# Patient Record
Sex: Female | Born: 1945 | Race: White | Hispanic: No | Marital: Married | Smoking: Former smoker
Health system: Southern US, Community
[De-identification: ages and names within clinical notes are randomized; demographics above are authoritative.]

## PROBLEM LIST (undated history)

## (undated) DIAGNOSIS — E039 Hypothyroidism, unspecified: Secondary | ICD-10-CM

## (undated) DIAGNOSIS — M810 Age-related osteoporosis without current pathological fracture: Secondary | ICD-10-CM

## (undated) DIAGNOSIS — E079 Disorder of thyroid, unspecified: Secondary | ICD-10-CM

## (undated) DIAGNOSIS — F32A Depression, unspecified: Secondary | ICD-10-CM

## (undated) DIAGNOSIS — F419 Anxiety disorder, unspecified: Secondary | ICD-10-CM

## (undated) DIAGNOSIS — L309 Dermatitis, unspecified: Secondary | ICD-10-CM

## (undated) DIAGNOSIS — F329 Major depressive disorder, single episode, unspecified: Secondary | ICD-10-CM

## (undated) DIAGNOSIS — B009 Herpesviral infection, unspecified: Secondary | ICD-10-CM

## (undated) HISTORY — PX: ABDOMINAL HYSTERECTOMY: SHX81

## (undated) HISTORY — PX: CARPAL TUNNEL RELEASE: SHX101

## (undated) HISTORY — DX: Dermatitis, unspecified: L30.9

## (undated) HISTORY — PX: KNEE ARTHROSCOPY: SHX127

## (undated) HISTORY — DX: Disorder of thyroid, unspecified: E07.9

## (undated) HISTORY — DX: Depression, unspecified: F32.A

## (undated) HISTORY — DX: Anxiety disorder, unspecified: F41.9

## (undated) HISTORY — DX: Major depressive disorder, single episode, unspecified: F32.9

## (undated) HISTORY — PX: BUNIONECTOMY: SHX129

## (undated) HISTORY — DX: Age-related osteoporosis without current pathological fracture: M81.0

## (undated) HISTORY — DX: Herpesviral infection, unspecified: B00.9

---

## 1997-11-17 ENCOUNTER — Encounter: Payer: Self-pay | Admitting: Family Medicine

## 1998-11-23 ENCOUNTER — Other Ambulatory Visit: Admission: RE | Admit: 1998-11-23 | Discharge: 1998-11-23 | Payer: Self-pay | Admitting: Obstetrics and Gynecology

## 1999-12-06 ENCOUNTER — Other Ambulatory Visit: Admission: RE | Admit: 1999-12-06 | Discharge: 1999-12-06 | Payer: Self-pay | Admitting: Obstetrics and Gynecology

## 2000-12-25 ENCOUNTER — Other Ambulatory Visit: Admission: RE | Admit: 2000-12-25 | Discharge: 2000-12-25 | Payer: Self-pay | Admitting: Obstetrics and Gynecology

## 2001-12-31 ENCOUNTER — Other Ambulatory Visit: Admission: RE | Admit: 2001-12-31 | Discharge: 2001-12-31 | Payer: Self-pay | Admitting: Obstetrics and Gynecology

## 2003-02-24 ENCOUNTER — Other Ambulatory Visit: Admission: RE | Admit: 2003-02-24 | Discharge: 2003-02-24 | Payer: Self-pay | Admitting: Obstetrics and Gynecology

## 2003-04-10 ENCOUNTER — Ambulatory Visit (HOSPITAL_COMMUNITY): Admission: RE | Admit: 2003-04-10 | Discharge: 2003-04-10 | Payer: Self-pay | Admitting: Gastroenterology

## 2003-04-10 ENCOUNTER — Encounter: Payer: Self-pay | Admitting: Family Medicine

## 2005-10-24 ENCOUNTER — Ambulatory Visit: Payer: Self-pay

## 2005-10-24 ENCOUNTER — Ambulatory Visit: Payer: Self-pay | Admitting: Internal Medicine

## 2006-09-19 ENCOUNTER — Encounter: Payer: Self-pay | Admitting: Family Medicine

## 2007-08-21 ENCOUNTER — Ambulatory Visit: Payer: Self-pay | Admitting: Family Medicine

## 2007-08-21 DIAGNOSIS — J309 Allergic rhinitis, unspecified: Secondary | ICD-10-CM | POA: Insufficient documentation

## 2007-08-21 DIAGNOSIS — N951 Menopausal and female climacteric states: Secondary | ICD-10-CM

## 2007-08-21 DIAGNOSIS — M8588 Other specified disorders of bone density and structure, other site: Secondary | ICD-10-CM | POA: Insufficient documentation

## 2007-08-21 DIAGNOSIS — E785 Hyperlipidemia, unspecified: Secondary | ICD-10-CM

## 2007-08-21 DIAGNOSIS — L259 Unspecified contact dermatitis, unspecified cause: Secondary | ICD-10-CM

## 2007-10-29 ENCOUNTER — Ambulatory Visit: Payer: Self-pay | Admitting: Family Medicine

## 2007-10-29 DIAGNOSIS — L293 Anogenital pruritus, unspecified: Secondary | ICD-10-CM | POA: Insufficient documentation

## 2007-11-26 ENCOUNTER — Ambulatory Visit: Payer: Self-pay | Admitting: Family Medicine

## 2007-11-27 LAB — CONVERTED CEMR LAB
ALT: 26 units/L (ref 0–35)
AST: 38 units/L — ABNORMAL HIGH (ref 0–37)
Albumin: 4.1 g/dL (ref 3.5–5.2)
Alkaline Phosphatase: 43 units/L (ref 39–117)
BUN: 15 mg/dL (ref 6–23)
Basophils Absolute: 0 10*3/uL (ref 0.0–0.1)
Basophils Relative: 0.1 % (ref 0.0–3.0)
Bilirubin, Direct: 0.2 mg/dL (ref 0.0–0.3)
CO2: 31 meq/L (ref 19–32)
Calcium: 9.2 mg/dL (ref 8.4–10.5)
Direct LDL: 102.6 mg/dL
GFR calc non Af Amer: 60 mL/min
Glucose, Bld: 80 mg/dL (ref 70–99)
HCT: 39.8 % (ref 36.0–46.0)
HDL: 78.4 mg/dL (ref >39.0)
Hemoglobin: 13.7 g/dL (ref 12.0–15.0)
MCV: 101 fL — ABNORMAL HIGH (ref 78.0–100.0)
Monocytes Absolute: 0.4 10*3/uL (ref 0.1–1.0)
Neutro Abs: 1.9 10*3/uL (ref 1.4–7.7)
Potassium: 4.3 meq/L (ref 3.5–5.1)
RBC: 3.94 M/uL (ref 3.87–5.11)
RDW: 11.8 % (ref 11.5–14.6)
Sodium: 141 meq/L (ref 135–145)
TSH: 33.55 microintl units/mL — ABNORMAL HIGH (ref 0.35–5.50)

## 2007-12-04 ENCOUNTER — Ambulatory Visit: Payer: Self-pay | Admitting: Family Medicine

## 2007-12-04 ENCOUNTER — Other Ambulatory Visit: Admission: RE | Admit: 2007-12-04 | Discharge: 2007-12-04 | Payer: Self-pay | Admitting: Family Medicine

## 2007-12-04 ENCOUNTER — Encounter: Payer: Self-pay | Admitting: Family Medicine

## 2007-12-04 DIAGNOSIS — E039 Hypothyroidism, unspecified: Secondary | ICD-10-CM | POA: Insufficient documentation

## 2007-12-09 LAB — CONVERTED CEMR LAB: T3 Uptake Ratio: 38 % — ABNORMAL HIGH (ref 22.5–37.0)

## 2007-12-10 ENCOUNTER — Encounter: Admission: RE | Admit: 2007-12-10 | Discharge: 2007-12-10 | Payer: Self-pay | Admitting: Family Medicine

## 2007-12-10 ENCOUNTER — Encounter: Payer: Self-pay | Admitting: Family Medicine

## 2007-12-20 ENCOUNTER — Telehealth (INDEPENDENT_AMBULATORY_CARE_PROVIDER_SITE_OTHER): Payer: Self-pay | Admitting: *Deleted

## 2008-01-07 ENCOUNTER — Encounter: Admission: RE | Admit: 2008-01-07 | Discharge: 2008-01-07 | Payer: Self-pay | Admitting: Family Medicine

## 2008-01-16 ENCOUNTER — Encounter: Admission: RE | Admit: 2008-01-16 | Discharge: 2008-01-16 | Payer: Self-pay | Admitting: Family Medicine

## 2008-01-23 DIAGNOSIS — R928 Other abnormal and inconclusive findings on diagnostic imaging of breast: Secondary | ICD-10-CM | POA: Insufficient documentation

## 2008-01-31 ENCOUNTER — Ambulatory Visit: Payer: Self-pay | Admitting: Family Medicine

## 2008-02-05 LAB — CONVERTED CEMR LAB: TSH: 12.91 microintl units/mL — ABNORMAL HIGH (ref 0.35–5.50)

## 2008-02-08 ENCOUNTER — Ambulatory Visit: Payer: Self-pay | Admitting: Family Medicine

## 2008-02-14 ENCOUNTER — Encounter: Payer: Self-pay | Admitting: Family Medicine

## 2008-02-15 ENCOUNTER — Encounter: Payer: Self-pay | Admitting: Family Medicine

## 2008-02-15 ENCOUNTER — Telehealth: Payer: Self-pay | Admitting: Family Medicine

## 2008-03-24 ENCOUNTER — Ambulatory Visit: Payer: Self-pay | Admitting: Family Medicine

## 2008-03-25 ENCOUNTER — Ambulatory Visit: Payer: Self-pay | Admitting: Family Medicine

## 2008-03-25 ENCOUNTER — Encounter (INDEPENDENT_AMBULATORY_CARE_PROVIDER_SITE_OTHER): Payer: Self-pay | Admitting: Internal Medicine

## 2008-03-25 DIAGNOSIS — M25559 Pain in unspecified hip: Secondary | ICD-10-CM

## 2008-03-25 LAB — CONVERTED CEMR LAB
Blood in Urine, dipstick: NEGATIVE
Nitrite: NEGATIVE
pH: 6

## 2008-03-26 ENCOUNTER — Telehealth (INDEPENDENT_AMBULATORY_CARE_PROVIDER_SITE_OTHER): Payer: Self-pay | Admitting: Internal Medicine

## 2008-04-14 ENCOUNTER — Encounter: Payer: Self-pay | Admitting: Family Medicine

## 2008-12-17 ENCOUNTER — Ambulatory Visit: Payer: Self-pay | Admitting: Family Medicine

## 2008-12-17 ENCOUNTER — Encounter (INDEPENDENT_AMBULATORY_CARE_PROVIDER_SITE_OTHER): Payer: Self-pay | Admitting: Internal Medicine

## 2008-12-23 ENCOUNTER — Ambulatory Visit: Payer: Self-pay | Admitting: Family Medicine

## 2008-12-23 DIAGNOSIS — J020 Streptococcal pharyngitis: Secondary | ICD-10-CM

## 2008-12-23 DIAGNOSIS — L2089 Other atopic dermatitis: Secondary | ICD-10-CM

## 2009-05-26 ENCOUNTER — Telehealth: Payer: Self-pay | Admitting: Family Medicine

## 2010-02-21 ENCOUNTER — Encounter: Payer: Self-pay | Admitting: Family Medicine

## 2010-03-04 NOTE — Progress Notes (Signed)
Summary: refill request for valtrex  Phone Note Refill Request Message from:  Fax from Pharmacy  Refills Requested: Medication #1:  VALTREX 1 GM  TABS 1/2  to 1 by mouth once daily as directed Faxed request from [facility].  Request is for 500mg 's, take one to two daily.  Please advise.  Initial call taken by: Lowella Petties CMA,  May 26, 2009 3:03 PM  Follow-up for Phone Call        px written on EMR for call in   Follow-up by: Judith Part MD,  May 26, 2009 4:58 PM  Additional Follow-up for Phone Call Additional follow up Details #1::        Medication phoned to [facility] pharmacy as instructed. Lewanda Rife LPN  May 26, 2009 5:02 PM     New/Updated Medications: VALTREX 500 MG TABS (VALACYCLOVIR HCL) 1-2 by mouth once daily as directed Prescriptions: VALTREX 500 MG TABS (VALACYCLOVIR HCL) 1-2 by mouth once daily as directed  #60 x 11   Entered and Authorized by:   Judith Part MD   Signed by:   Lewanda Rife LPN on 91/47/8295   Method used:   Telephoned to ...       [facility]* (retail)       [address]       [place], [place]  [address]       Ph: [phone]       Fax: [phone]   RxID:   [id number]

## 2010-05-27 ENCOUNTER — Other Ambulatory Visit: Payer: Self-pay | Admitting: *Deleted

## 2010-05-27 DIAGNOSIS — B009 Herpesviral infection, unspecified: Secondary | ICD-10-CM | POA: Insufficient documentation

## 2010-05-27 MED ORDER — VALACYCLOVIR HCL 500 MG PO TABS: ORAL_TABLET | ORAL | Status: DC

## 2010-05-27 NOTE — Telephone Encounter (Signed)
Medication phoned to [facility] as instructed.  

## 2010-05-27 NOTE — Telephone Encounter (Signed)
Px written for call in   

## 2010-06-18 NOTE — Op Note (Signed)
Courtney Jensen, Courtney Jensen                         ACCOUNT NO.:  [id number]   MEDICAL RECORD NO.:  [id number]                   PATIENT TYPE:  AMB   LOCATION:  ENDO                                 FACILITY:  [facility]   PHYSICIAN:  Bernette Redbird, M.D.                DATE OF BIRTH:  05-Oct-1945   DATE OF PROCEDURE:  04/10/2003  DATE OF DISCHARGE:                                 OPERATIVE REPORT   PROCEDURE:  Colonoscopy.   INDICATIONS:  Screening for colon cancer in an asymptomatic 65 year old  female.   FINDINGS:  Normal exam to the cecum.   DESCRIPTION OF PROCEDURE:  The nature, purpose and risks of the procedure  have been discussed with the patient who provided written consent.  Sedation  was fentanyl 75 mcg and Versed 6 mg IV without arrhythmias or desaturation.  The Olympus adjustable tension pediatric video coloscope was advanced around  the colon to the base of the cecum as identified by clear visualization of  the appendiceal orifice, turning the patient into the supine position to  facilitate the tip of the scope dropping into the base of the cecum.  Pull-  back was then performed.  The quality of prep was excellent and it was felt  that all areas were well seen.   This was a normal examination.  No polyps, cancer, colitis, vascular  malformations, or diverticulosis were noted and retroflexion of the rectum  was normal.  No biopsies were obtained. The patient tolerated the procedure  well and there were no apparent complications.   IMPRESSION:  Normal colonoscopy to the cecum.   PLAN:  Flexible sigmoidoscopy in five years.                                               Bernette Redbird, M.D.    RB/MEDQ  D:  04/10/2003  T:  04/10/2003  Job:  [id number]   cc:   Juluis Mire, M.D.  [address] [place]  [place] [address]  Fax: [phone]

## 2010-06-18 NOTE — Procedures (Signed)
[facility]                                EXERCISE SHADA, NIENABER                      MRN:          [id number]  DATE:10/24/2005                            DOB:          [birth year]    REFERRING PHYSICIAN:  Juluis Mire, M.D.   REFERRING PHYSICIAN:  Juluis Mire, M.D. at [facility] in  [place].   PATIENT IDENTIFICATION:  Courtney Jensen is a very pleasant 65 year old woman  with a strong family history of coronary artery disease as well as  hyperlipidemia.  She is referred for a routine screening treadmill test.   BASELINE DATA:  Resting blood pressure was 105/72 with a heart rate of 64.  EKG showed normal sinus rhythm at a rate of 64 and a slight rightward axis.  No significant ST or T-wave changes.   EXERCISE DATA:  The patient walked for 12 minutes on a standard Bruce  protocol.  She stopped due to fatigue and feeling hot.  There is no chest  pain or shortness of breath.  Peak heart rate was 142 which is 88% of age  predicted maximum.  Peak blood pressure was 158/67.   Exercise EKG showed mild upsloping ST depression which was nondiagnostic.  In early recovery, there were frequent PVCs and a very short run of  supraventricular tachycardia which was asymptomatic.   Post exercise, there was good heart rate recovery with a drop of  approximately 45 beats in the first minute.   CONCLUSION:  1. Negative exercise treadmill with no evidence of exercise-induced      ischemia.  2. Excellent exercise tolerance.  3. Good heart rate recovery.  4. Frequent premature ventricular contractures and brief run of      supraventricular tachycardias in recovery.   This was a negative stress test.  Would recommend risk factor management.                                   Bevelyn Buckles. Bensimhon, MD   DRB/MedQ  DD:  10/24/2005  DT:  10/26/2005  Job #:  [id number]   cc:   Juluis Mire, M.D.

## 2011-04-11 DIAGNOSIS — E039 Hypothyroidism, unspecified: Secondary | ICD-10-CM | POA: Diagnosis not present

## 2011-07-11 ENCOUNTER — Other Ambulatory Visit: Payer: Self-pay | Admitting: *Deleted

## 2011-07-11 NOTE — Telephone Encounter (Signed)
Please find out if she is still a pt here - if so refil for 1 mo and make f/u appt-thanks

## 2011-07-11 NOTE — Telephone Encounter (Signed)
Spoke with patient and she said she has typically been getting her refills from her GYN. She said the pharmacy must've gotten things confused. Offered to refill and schedule follow up for patient and she refused stating she had appt with GYN next week and would call that office for RF.

## 2011-07-11 NOTE — Telephone Encounter (Signed)
Received faxed refill request from pharmacy. Patient has not been seen in over 2 years. Is it okay to refill medication?

## 2011-07-14 ENCOUNTER — Other Ambulatory Visit: Payer: Self-pay | Admitting: Family Medicine

## 2011-07-14 NOTE — Telephone Encounter (Signed)
Electronic refill request

## 2011-07-15 NOTE — Telephone Encounter (Signed)
Sent!

## 2011-07-18 DIAGNOSIS — Z1231 Encounter for screening mammogram for malignant neoplasm of breast: Secondary | ICD-10-CM | POA: Diagnosis not present

## 2011-07-18 DIAGNOSIS — Z01419 Encounter for gynecological examination (general) (routine) without abnormal findings: Secondary | ICD-10-CM | POA: Diagnosis not present

## 2011-08-01 DIAGNOSIS — H35369 Drusen (degenerative) of macula, unspecified eye: Secondary | ICD-10-CM | POA: Diagnosis not present

## 2011-08-01 DIAGNOSIS — H524 Presbyopia: Secondary | ICD-10-CM | POA: Diagnosis not present

## 2011-10-17 DIAGNOSIS — E039 Hypothyroidism, unspecified: Secondary | ICD-10-CM | POA: Diagnosis not present

## 2011-11-17 ENCOUNTER — Ambulatory Visit (INDEPENDENT_AMBULATORY_CARE_PROVIDER_SITE_OTHER): Payer: Medicare Other | Admitting: Infectious Disease

## 2011-11-17 VITALS — BP 122/81 | HR 65 | Temp 98.0°F | Wt 134.0 lb

## 2011-11-17 DIAGNOSIS — A6000 Herpesviral infection of urogenital system, unspecified: Secondary | ICD-10-CM | POA: Insufficient documentation

## 2011-11-17 DIAGNOSIS — N952 Postmenopausal atrophic vaginitis: Secondary | ICD-10-CM | POA: Diagnosis not present

## 2011-11-17 MED ORDER — VALACYCLOVIR HCL 500 MG PO TABS: 500.0000 mg | ORAL_TABLET | ORAL | Status: DC

## 2011-11-17 NOTE — Progress Notes (Signed)
  Subjective:    Patient ID: Courtney Jensen, female    DOB: 1945-03-02, 66 y.o.   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 Initially lesions would largely occur on buttocks on left side and were far less frequent. Now she is having outbreaks up to every 4-6 weeks DESPITE valtrex 500mg  daily prophylaxis. Outbreaks are now in vaginal area and apparently once intra-rectally. She can have a fair amout of dicomfort with these. She currently has no active lesions. She was referred by her PCP/Ob/GYn for evaluation and workup.  I can see no evidence of obvious immune deficiency, but think we should screen for HIV. I also think the prophylactic dose of valtrex should be increased to see if having more in system round the clock may help. I spent greater than 60 minutes with the patient including greater than 50% of time in face to face counsel of the patient and in coordination of their care.    Review of Systems  Constitutional: Negative for fever, chills, diaphoresis, activity change, appetite change, fatigue and unexpected weight change.  HENT: Negative for congestion, sore throat, rhinorrhea, sneezing, trouble swallowing and sinus pressure.   Eyes: Negative for photophobia and visual disturbance.  Respiratory: Negative for cough, chest tightness, shortness of breath, wheezing and stridor.   Cardiovascular: Negative for chest pain, palpitations and leg swelling.  Gastrointestinal: Negative for nausea, vomiting, abdominal pain, diarrhea, constipation, blood in stool, abdominal distention and anal bleeding.  Genitourinary: Negative for dysuria, hematuria, flank pain and difficulty urinating.  Musculoskeletal: Negative for myalgias, back pain, joint swelling, arthralgias and gait problem.  Skin: Negative for color change, pallor, rash and wound.  Neurological: Negative for dizziness, tremors,  weakness and light-headedness.  Hematological: Negative for adenopathy. Does not bruise/bleed easily.  Psychiatric/Behavioral: Negative for behavioral problems, confusion, disturbed wake/sleep cycle, dysphoric mood, decreased concentration and agitation.       Objective:   Physical Exam  Constitutional: She is oriented to person, place, and time. She appears well-developed and well-nourished. No distress.  HENT:  Head: Normocephalic and atraumatic.  Mouth/Throat: Oropharynx is clear and moist. No oropharyngeal exudate.  Eyes: Conjunctivae normal and EOM are normal. Pupils are equal, round, and reactive to light. No scleral icterus.  Neck: Normal range of motion. Neck supple. No JVD present.  Cardiovascular: Normal rate, regular rhythm and normal heart sounds.  Exam reveals no gallop and no friction rub.   No murmur heard. Pulmonary/Chest: Effort normal and breath sounds normal. No respiratory distress. She has no wheezes. She has no rales. She exhibits no tenderness.  Abdominal: She exhibits no distension and no mass. There is no tenderness. There is no rebound and no guarding.  Musculoskeletal: She exhibits no edema and no tenderness.  Lymphadenopathy:    She has no cervical adenopathy.  Neurological: She is alert and oriented to person, place, and time. She has normal reflexes. She exhibits normal muscle tone. Coordination normal.  Skin: Skin is warm and dry. She is not diaphoretic. No erythema. No pallor.  Psychiatric: She has a normal mood and affect. Her behavior is normal. Judgment and thought content normal.          Assessment & Plan:     Recurrent Genital Herpes: --screen for HIV with ab test --increase valtrex to 500mg  twice daily and then have her use 2 tabs bid for up to 10 days for outbreaks

## 2011-11-18 LAB — HIV ANTIBODY (ROUTINE TESTING W REFLEX): HIV: NONREACTIVE

## 2011-11-22 DIAGNOSIS — Z23 Encounter for immunization: Secondary | ICD-10-CM | POA: Diagnosis not present

## 2012-01-10 DIAGNOSIS — A6004 Herpesviral vulvovaginitis: Secondary | ICD-10-CM | POA: Diagnosis not present

## 2012-02-20 ENCOUNTER — Ambulatory Visit: Payer: Medicare Other | Admitting: Infectious Disease

## 2012-02-22 ENCOUNTER — Ambulatory Visit: Payer: Medicare Other | Admitting: Infectious Disease

## 2012-02-27 ENCOUNTER — Encounter: Payer: Self-pay | Admitting: Infectious Disease

## 2012-02-27 ENCOUNTER — Ambulatory Visit (INDEPENDENT_AMBULATORY_CARE_PROVIDER_SITE_OTHER): Payer: Medicare Other | Admitting: Infectious Disease

## 2012-02-27 VITALS — BP 133/81 | HR 60 | Temp 98.2°F | Wt 138.0 lb

## 2012-02-27 DIAGNOSIS — A6000 Herpesviral infection of urogenital system, unspecified: Secondary | ICD-10-CM

## 2012-02-27 DIAGNOSIS — F411 Generalized anxiety disorder: Secondary | ICD-10-CM | POA: Diagnosis not present

## 2012-02-27 DIAGNOSIS — F419 Anxiety disorder, unspecified: Secondary | ICD-10-CM

## 2012-02-27 MED ORDER — VALACYCLOVIR HCL 500 MG PO TABS: 500.0000 mg | ORAL_TABLET | ORAL | Status: DC

## 2012-02-27 NOTE — Progress Notes (Signed)
Subjective:    Patient ID: Courtney Jensen, female    DOB: 21-Mar-1945, 67 y.o.   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 Initially lesions would largely occur on buttocks on left side and were far less frequent. Now she is having outbreaks up to every 4-6 weeks DESPITE valtrex 500mg  daily prophylaxis. He increased her suppressive Valtrex dose to 500 mg twice daily but despite this she had a very painful outbreak in December. She's not had an outbreak since then but is on him one month's time since. She's been started on a selective serotonin reuptake inhibitor in hopes by primary care physician that this might reduce her stress and potentially reduce episodes of reactivation of her herpes simplex.  I've counseled her that is possible that her herpes might possibly be resistant to acyclovir. We could prove or disprove this if we had active lesions to send for amplification for herpes simplex PCR and for testing for resistance genes. Unfortunately this is the case then further therapy with valacyclovir famciclovir or acyclovir will not be of use. If this is in fact the case there is not really another viable suppressive regimen for her as other drugs that would be acting to be foscarnet and cidofovir both of which have toxicities it would not be worth the risk in this patient.  Review of Systems  Constitutional: Negative for fever, chills, diaphoresis, activity change, appetite change, fatigue and unexpected weight change.  HENT: Negative for congestion, sore throat, rhinorrhea, sneezing, trouble swallowing and sinus pressure.   Eyes: Negative for photophobia and visual disturbance.  Respiratory: Negative for cough, chest tightness, shortness of breath, wheezing and stridor.   Cardiovascular: Negative for chest pain, palpitations and leg swelling.  Gastrointestinal: Negative for nausea,  vomiting, abdominal pain, diarrhea, constipation, blood in stool, abdominal distention and anal bleeding.  Genitourinary: Negative for dysuria, hematuria, flank pain and difficulty urinating.  Musculoskeletal: Negative for myalgias, back pain, joint swelling, arthralgias and gait problem.  Skin: Negative for color change, pallor, rash and wound.  Neurological: Negative for dizziness, tremors, weakness and light-headedness.  Hematological: Negative for adenopathy. Does not bruise/bleed easily.  Psychiatric/Behavioral: Positive for dysphoric mood. Negative for behavioral problems, confusion, sleep disturbance, decreased concentration and agitation.       Objective:   Physical Exam  Constitutional: She is oriented to person, place, and time. She appears well-developed and well-nourished. No distress.  HENT:  Head: Normocephalic and atraumatic.  Mouth/Throat: Oropharynx is clear and moist. No oropharyngeal exudate.  Eyes: Conjunctivae normal and EOM are normal. Pupils are equal, round, and reactive to light. No scleral icterus.  Neck: Normal range of motion. Neck supple. No JVD present.  Cardiovascular: Normal rate, regular rhythm and normal heart sounds.  Exam reveals no gallop and no friction rub.   No murmur heard. Pulmonary/Chest: Effort normal and breath sounds normal. No respiratory distress. She has no wheezes. She has no rales. She exhibits no tenderness.  Abdominal: She exhibits no distension and no mass. There is no tenderness. There is no rebound and no guarding.  Musculoskeletal: She exhibits no edema and no tenderness.  Lymphadenopathy:    She has no cervical adenopathy.  Neurological: She is alert and oriented to person, place, and time. She exhibits normal muscle tone. Coordination normal.  Skin: Skin is warm and dry. She is not diaphoretic. No erythema. No pallor.  Psychiatric: She has a normal mood and  affect. Her behavior is normal. Judgment and thought content normal.           Assessment & Plan:  HSV recurrent: increase dose of valtrex to 1g in am and 500mg  in the pm. If she has another outbreak I would like her to come to clinic so that we can unroof a lesion send it for PCR amplification and Resistance testing  Anxiety: her SSRI should be fine to take with this and fu with her PCP

## 2012-03-17 ENCOUNTER — Other Ambulatory Visit: Payer: Self-pay

## 2012-04-23 ENCOUNTER — Ambulatory Visit (INDEPENDENT_AMBULATORY_CARE_PROVIDER_SITE_OTHER): Payer: Medicare Other | Admitting: Infectious Disease

## 2012-04-23 ENCOUNTER — Encounter: Payer: Self-pay | Admitting: Infectious Disease

## 2012-04-23 VITALS — BP 121/71 | HR 57 | Temp 97.8°F | Wt 138.0 lb

## 2012-04-23 DIAGNOSIS — A6000 Herpesviral infection of urogenital system, unspecified: Secondary | ICD-10-CM

## 2012-04-23 DIAGNOSIS — F329 Major depressive disorder, single episode, unspecified: Secondary | ICD-10-CM

## 2012-04-23 NOTE — Progress Notes (Signed)
Subjective:    Patient ID: Courtney Jensen, female    DOB: [birth year], 67 y.o.   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 Initially lesions would largely occur on buttocks on left side and were far less frequent.  She then started having outbreaks up to every 4-6 weeks DESPITE valtrex 500mg  daily prophylaxis. We increased her suppressive Valtrex dose to 500 mg twice daily but despite this she had a very painful outbreak in December.   We  counseled her that is possible that her herpes might possibly be resistant to acyclovir. We could prove or disprove this if we had active lesions to send for amplification for herpes simplex PCR and for testing for resistance genes.   In the interim we went up to 1 g in am and 500mg  in pm and she had ONE episode in January (was out of town but could not come in for testing of virus) Since then NO outbreaks  Review of Systems  Constitutional: Negative for fever, chills, diaphoresis, activity change, appetite change, fatigue and unexpected weight change.  HENT: Negative for congestion, sore throat, rhinorrhea, sneezing, trouble swallowing and sinus pressure.   Eyes: Negative for photophobia and visual disturbance.  Respiratory: Negative for cough, chest tightness, shortness of breath, wheezing and stridor.   Cardiovascular: Negative for chest pain, palpitations and leg swelling.  Gastrointestinal: Negative for nausea, vomiting, abdominal pain, diarrhea, constipation, blood in stool, abdominal distention and anal bleeding.  Genitourinary: Negative for dysuria, hematuria, flank pain and difficulty urinating.  Musculoskeletal: Negative for myalgias, back pain, joint swelling, arthralgias and gait problem.  Skin: Negative for color change, pallor, rash and wound.  Neurological: Negative for dizziness, tremors, weakness and light-headedness.   Hematological: Negative for adenopathy. Does not bruise/bleed easily.  Psychiatric/Behavioral: Positive for dysphoric mood. Negative for behavioral problems, confusion, sleep disturbance, decreased concentration and agitation.       Objective:   Physical Exam  Constitutional: She is oriented to person, place, and time. She appears well-developed and well-nourished. No distress.  HENT:  Head: Normocephalic and atraumatic.  Mouth/Throat: Oropharynx is clear and moist. No oropharyngeal exudate.  Eyes: Conjunctivae and EOM are normal. Pupils are equal, round, and reactive to light. No scleral icterus.  Neck: Normal range of motion. Neck supple. No JVD present.  Cardiovascular: Normal rate, regular rhythm and normal heart sounds.  Exam reveals no gallop and no friction rub.   No murmur heard. Pulmonary/Chest: Effort normal and breath sounds normal. No respiratory distress. She has no wheezes. She has no rales. She exhibits no tenderness.  Abdominal: She exhibits no distension and no mass. There is no tenderness. There is no rebound and no guarding.  Musculoskeletal: She exhibits no edema and no tenderness.  Lymphadenopathy:    She has no cervical adenopathy.  Neurological: She is alert and oriented to person, place, and time. She exhibits normal muscle tone. Coordination normal.  Skin: Skin is warm and dry. She is not diaphoretic. No erythema. No pallor.  Psychiatric: She has a normal mood and affect. Her behavior is normal. Judgment and thought content normal.          Assessment & Plan:  HSV recurrent: continue increased dose of valtrex to 1g in am and 500mg  in the pm. If she has another outbreak I would like her to come to clinic so that we can unroof a lesion send it for PCR amplification  and Resistance testing  Anxiety:  On SSRI s

## 2012-05-09 ENCOUNTER — Telehealth: Payer: Self-pay | Admitting: *Deleted

## 2012-05-09 NOTE — Telephone Encounter (Signed)
Received refill request from [facility] for valacyclovir.  Dose increased at last OV.  Message left requesting pt to call [facility] to let MD know whether the increased dose is suppressing the herpes lesions prior to refilling medication.

## 2012-05-10 ENCOUNTER — Other Ambulatory Visit: Payer: Self-pay | Admitting: *Deleted

## 2012-05-10 DIAGNOSIS — B029 Zoster without complications: Secondary | ICD-10-CM

## 2012-05-10 MED ORDER — VALACYCLOVIR HCL 500 MG PO TABS: 500.0000 mg | ORAL_TABLET | ORAL | Status: DC

## 2012-08-15 ENCOUNTER — Telehealth: Payer: Self-pay | Admitting: *Deleted

## 2012-08-15 DIAGNOSIS — B029 Zoster without complications: Secondary | ICD-10-CM

## 2012-08-15 MED ORDER — VALACYCLOVIR HCL 500 MG PO TABS: 500.0000 mg | ORAL_TABLET | ORAL | Status: DC

## 2012-08-15 NOTE — Telephone Encounter (Signed)
Fine to refill 

## 2012-09-05 ENCOUNTER — Other Ambulatory Visit: Payer: Self-pay

## 2012-10-08 DIAGNOSIS — E039 Hypothyroidism, unspecified: Secondary | ICD-10-CM | POA: Diagnosis not present

## 2012-10-08 DIAGNOSIS — Z79899 Other long term (current) drug therapy: Secondary | ICD-10-CM | POA: Diagnosis not present

## 2012-10-08 DIAGNOSIS — Z131 Encounter for screening for diabetes mellitus: Secondary | ICD-10-CM | POA: Diagnosis not present

## 2012-10-08 DIAGNOSIS — Z1231 Encounter for screening mammogram for malignant neoplasm of breast: Secondary | ICD-10-CM | POA: Diagnosis not present

## 2012-10-08 DIAGNOSIS — Z13820 Encounter for screening for osteoporosis: Secondary | ICD-10-CM | POA: Diagnosis not present

## 2012-10-08 DIAGNOSIS — E7800 Pure hypercholesterolemia, unspecified: Secondary | ICD-10-CM | POA: Diagnosis not present

## 2012-10-08 DIAGNOSIS — M899 Disorder of bone, unspecified: Secondary | ICD-10-CM | POA: Diagnosis not present

## 2012-10-15 DIAGNOSIS — E039 Hypothyroidism, unspecified: Secondary | ICD-10-CM | POA: Diagnosis not present

## 2012-10-15 DIAGNOSIS — Z124 Encounter for screening for malignant neoplasm of cervix: Secondary | ICD-10-CM | POA: Diagnosis not present

## 2012-10-15 DIAGNOSIS — N952 Postmenopausal atrophic vaginitis: Secondary | ICD-10-CM | POA: Diagnosis not present

## 2012-10-15 DIAGNOSIS — A6000 Herpesviral infection of urogenital system, unspecified: Secondary | ICD-10-CM | POA: Diagnosis not present

## 2012-10-29 ENCOUNTER — Ambulatory Visit (INDEPENDENT_AMBULATORY_CARE_PROVIDER_SITE_OTHER): Payer: Medicare Other | Admitting: Infectious Disease

## 2012-10-29 ENCOUNTER — Encounter: Payer: Self-pay | Admitting: Infectious Disease

## 2012-10-29 VITALS — BP 125/75 | HR 51 | Temp 98.5°F | Wt 147.0 lb

## 2012-10-29 DIAGNOSIS — F341 Dysthymic disorder: Secondary | ICD-10-CM

## 2012-10-29 DIAGNOSIS — F329 Major depressive disorder, single episode, unspecified: Secondary | ICD-10-CM

## 2012-10-29 DIAGNOSIS — B009 Herpesviral infection, unspecified: Secondary | ICD-10-CM | POA: Diagnosis not present

## 2012-10-29 DIAGNOSIS — Z23 Encounter for immunization: Secondary | ICD-10-CM

## 2012-10-29 DIAGNOSIS — L309 Dermatitis, unspecified: Secondary | ICD-10-CM | POA: Insufficient documentation

## 2012-10-29 NOTE — Progress Notes (Signed)
  Subjective:    Patient ID: Courtney Jensen, female    DOB: 24-Jan-1946, 67 y.o.   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 Initially lesions would largely occur on buttocks on left side and were far less frequent.  She then started having outbreaks up to every 4-6 weeks DESPITE valtrex 500mg  daily prophylaxis. We increased her suppressive Valtrex dose to 500 mg twice daily but despite this she had a very painful outbreak in December.   We then went up to 1 g in am and 500mg  in pm and she had she has had very infrequent outbreaks since then. She correlates outbreaks with missing the full dose cyst of her Valtrex. She is also been started on Zoloft which he thinks is helping.  Review of Systems  Constitutional: Negative for fever, chills, diaphoresis, activity change, appetite change, fatigue and unexpected weight change.  HENT: Negative for congestion, sore throat, rhinorrhea, sneezing, trouble swallowing and sinus pressure.   Eyes: Negative for photophobia and visual disturbance.  Respiratory: Negative for cough, chest tightness, shortness of breath, wheezing and stridor.   Cardiovascular: Negative for chest pain, palpitations and leg swelling.  Gastrointestinal: Negative for nausea, vomiting, abdominal pain, diarrhea, constipation, blood in stool, abdominal distention and anal bleeding.  Genitourinary: Negative for dysuria, hematuria, flank pain and difficulty urinating.  Musculoskeletal: Negative for myalgias, back pain, joint swelling, arthralgias and gait problem.  Skin: Negative for color change, pallor, rash and wound.  Neurological: Negative for dizziness, tremors, weakness and light-headedness.  Hematological: Negative for adenopathy. Does not bruise/bleed easily.  Psychiatric/Behavioral: Positive for dysphoric mood. Negative for behavioral problems, confusion, sleep  disturbance, decreased concentration and agitation.       Objective:   Physical Exam  Constitutional: She is oriented to person, place, and time. She appears well-developed and well-nourished. No distress.  HENT:  Head: Normocephalic and atraumatic.  Mouth/Throat: Oropharynx is clear and moist. No oropharyngeal exudate.  Eyes: Conjunctivae and EOM are normal. Pupils are equal, round, and reactive to light. No scleral icterus.  Neck: Normal range of motion. Neck supple. No JVD present.  Cardiovascular: Normal rate, regular rhythm and normal heart sounds.  Exam reveals no gallop and no friction rub.   No murmur heard. Pulmonary/Chest: Effort normal and breath sounds normal. No respiratory distress. She has no wheezes. She has no rales. She exhibits no tenderness.  Abdominal: She exhibits no distension and no mass. There is no tenderness. There is no rebound and no guarding.  Musculoskeletal: She exhibits no edema and no tenderness.  Lymphadenopathy:    She has no cervical adenopathy.  Neurological: She is alert and oriented to person, place, and time. She exhibits normal muscle tone. Coordination normal.  Skin: Skin is warm and dry. She is not diaphoretic. No erythema. No pallor.  Psychiatric: She has a normal mood and affect. Her behavior is normal. Judgment and thought content normal.          Assessment & Plan:  HSV recurrent: continue increased dose of valtrex to 1g in am and 500mg  in the pm. If she has another outbreak I would like her to come to clinic so that we can unroof a lesion send it for PCR amplification and Resistance testing  Anxiety:  On SSRI s  HCM: give flu shot

## 2012-11-16 ENCOUNTER — Other Ambulatory Visit: Payer: Self-pay | Admitting: *Deleted

## 2012-11-16 DIAGNOSIS — B029 Zoster without complications: Secondary | ICD-10-CM

## 2012-11-16 MED ORDER — VALACYCLOVIR HCL 500 MG PO TABS: 500.0000 mg | ORAL_TABLET | ORAL | Status: DC

## 2013-02-12 ENCOUNTER — Other Ambulatory Visit: Payer: Self-pay | Admitting: Licensed Clinical Social Worker

## 2013-02-12 DIAGNOSIS — B029 Zoster without complications: Secondary | ICD-10-CM

## 2013-02-12 MED ORDER — VALACYCLOVIR HCL 500 MG PO TABS: 500.0000 mg | ORAL_TABLET | ORAL | Status: DC

## 2013-02-25 DIAGNOSIS — H524 Presbyopia: Secondary | ICD-10-CM | POA: Diagnosis not present

## 2013-02-25 DIAGNOSIS — H2510 Age-related nuclear cataract, unspecified eye: Secondary | ICD-10-CM | POA: Diagnosis not present

## 2013-04-13 LAB — HM MAMMOGRAPHY: HM MAMMO: NORMAL

## 2013-04-13 LAB — HM DEXA SCAN

## 2013-04-16 ENCOUNTER — Emergency Department: Payer: Self-pay | Admitting: Emergency Medicine

## 2013-04-16 DIAGNOSIS — E039 Hypothyroidism, unspecified: Secondary | ICD-10-CM | POA: Diagnosis not present

## 2013-04-16 DIAGNOSIS — S0990XA Unspecified injury of head, initial encounter: Secondary | ICD-10-CM | POA: Diagnosis not present

## 2013-04-16 DIAGNOSIS — S0083XA Contusion of other part of head, initial encounter: Secondary | ICD-10-CM | POA: Diagnosis not present

## 2013-04-16 DIAGNOSIS — Z79899 Other long term (current) drug therapy: Secondary | ICD-10-CM | POA: Diagnosis not present

## 2013-04-16 DIAGNOSIS — S0180XA Unspecified open wound of other part of head, initial encounter: Secondary | ICD-10-CM | POA: Diagnosis not present

## 2013-04-16 DIAGNOSIS — S0003XA Contusion of scalp, initial encounter: Secondary | ICD-10-CM | POA: Diagnosis not present

## 2013-04-16 DIAGNOSIS — R51 Headache: Secondary | ICD-10-CM | POA: Diagnosis not present

## 2013-04-25 ENCOUNTER — Emergency Department: Payer: Self-pay | Admitting: Emergency Medicine

## 2013-05-14 LAB — HM COLONOSCOPY: HM Colonoscopy: NORMAL

## 2013-05-21 ENCOUNTER — Other Ambulatory Visit: Payer: Self-pay | Admitting: Licensed Clinical Social Worker

## 2013-05-21 DIAGNOSIS — B029 Zoster without complications: Secondary | ICD-10-CM

## 2013-05-21 MED ORDER — VALACYCLOVIR HCL 500 MG PO TABS: 500.0000 mg | ORAL_TABLET | ORAL | Status: DC

## 2013-08-23 ENCOUNTER — Other Ambulatory Visit: Payer: Self-pay | Admitting: Licensed Clinical Social Worker

## 2013-08-23 DIAGNOSIS — B029 Zoster without complications: Secondary | ICD-10-CM

## 2013-08-23 MED ORDER — VALACYCLOVIR HCL 500 MG PO TABS: 500.0000 mg | ORAL_TABLET | ORAL | Status: DC

## 2013-09-17 DIAGNOSIS — K5730 Diverticulosis of large intestine without perforation or abscess without bleeding: Secondary | ICD-10-CM | POA: Diagnosis not present

## 2013-09-17 DIAGNOSIS — Z1211 Encounter for screening for malignant neoplasm of colon: Secondary | ICD-10-CM | POA: Diagnosis not present

## 2013-09-19 ENCOUNTER — Other Ambulatory Visit: Payer: Self-pay | Admitting: Licensed Clinical Social Worker

## 2013-09-19 DIAGNOSIS — B029 Zoster without complications: Secondary | ICD-10-CM

## 2013-09-19 MED ORDER — VALACYCLOVIR HCL 500 MG PO TABS: 500.0000 mg | ORAL_TABLET | ORAL | Status: DC

## 2013-10-14 DIAGNOSIS — E039 Hypothyroidism, unspecified: Secondary | ICD-10-CM | POA: Diagnosis not present

## 2013-11-01 DIAGNOSIS — Z23 Encounter for immunization: Secondary | ICD-10-CM | POA: Diagnosis not present

## 2013-12-09 DIAGNOSIS — E785 Hyperlipidemia, unspecified: Secondary | ICD-10-CM | POA: Diagnosis not present

## 2013-12-23 DIAGNOSIS — Z1231 Encounter for screening mammogram for malignant neoplasm of breast: Secondary | ICD-10-CM | POA: Diagnosis not present

## 2013-12-23 DIAGNOSIS — Z01419 Encounter for gynecological examination (general) (routine) without abnormal findings: Secondary | ICD-10-CM | POA: Diagnosis not present

## 2014-01-20 ENCOUNTER — Encounter: Payer: Self-pay | Admitting: Infectious Disease

## 2014-01-20 ENCOUNTER — Ambulatory Visit (INDEPENDENT_AMBULATORY_CARE_PROVIDER_SITE_OTHER): Payer: Medicare Other | Admitting: Infectious Disease

## 2014-01-20 VITALS — BP 119/76 | HR 60 | Temp 98.4°F | Wt 144.0 lb

## 2014-01-20 DIAGNOSIS — L218 Other seborrheic dermatitis: Secondary | ICD-10-CM

## 2014-01-20 DIAGNOSIS — L219 Seborrheic dermatitis, unspecified: Secondary | ICD-10-CM

## 2014-01-20 DIAGNOSIS — B009 Herpesviral infection, unspecified: Secondary | ICD-10-CM | POA: Diagnosis not present

## 2014-01-20 DIAGNOSIS — B029 Zoster without complications: Secondary | ICD-10-CM | POA: Diagnosis not present

## 2014-01-20 LAB — BASIC METABOLIC PANEL WITH GFR
BUN: 14 mg/dL (ref 6–23)
CHLORIDE: 106 meq/L (ref 96–112)
CO2: 25 mEq/L (ref 19–32)
Calcium: 9.2 mg/dL (ref 8.4–10.5)
Creat: 0.76 mg/dL (ref 0.50–1.10)
GFR, Est Non African American: 81 mL/min
Glucose, Bld: 85 mg/dL (ref 70–99)
Potassium: 3.9 mEq/L (ref 3.5–5.3)
SODIUM: 139 meq/L (ref 135–145)

## 2014-01-20 MED ORDER — VALACYCLOVIR HCL 500 MG PO TABS: 500.0000 mg | ORAL_TABLET | ORAL | Status: DC

## 2014-01-20 NOTE — Progress Notes (Signed)
  Subjective:    Patient ID: Courtney Jensen, female    DOB: 1945/11/13, 68 y.o.   MRN: [id number]  HPI   68 year old Caucasian lady with hx of Hypothyroidism, postmenopausal ssx, and recurrent genital herpes for past several decades. Initially lesions would largely occur on buttocks on left side and were far less frequent.  She then started having outbreaks up to every 4-6 weeks DESPITE valtrex 500mg  daily prophylaxis. We increased her suppressive Valtrex dose to 500 mg twice daily but despite this she had a very painful outbreak in December.   We then went up to 1 g in am and 500mg  in pm and she had she has had very infrequent outbreaks since then. She correlates outbreaks with missing the full doseof her Valtrex. It has now been nearly a year since I saw her and she still is having what she perceives as outbreaks every few months though less than in the past.  She also has had irritation of her scalp along her neck that she thinks is due to herpes--but clearly this is NOT the case.   Review of Systems  Constitutional: Negative for fever, chills, diaphoresis, activity change, appetite change, fatigue and unexpected weight change.  HENT: Negative for congestion, rhinorrhea, sinus pressure, sneezing, sore throat and trouble swallowing.   Eyes: Negative for photophobia and visual disturbance.  Respiratory: Negative for cough, chest tightness, shortness of breath, wheezing and stridor.   Cardiovascular: Negative for chest pain, palpitations and leg swelling.  Gastrointestinal: Negative for nausea, vomiting, abdominal pain, diarrhea, constipation, blood in stool, abdominal distention and anal bleeding.  Genitourinary: Negative for dysuria, hematuria, flank pain and difficulty urinating.  Musculoskeletal: Negative for myalgias, back pain, joint swelling, arthralgias and gait problem.  Skin: Positive for rash. Negative for color change, pallor and wound.  Neurological: Negative for dizziness,  tremors, weakness and light-headedness.  Hematological: Negative for adenopathy. Does not bruise/bleed easily.  Psychiatric/Behavioral: Positive for dysphoric mood. Negative for behavioral problems, confusion, sleep disturbance, decreased concentration and agitation.       Objective:   Physical Exam  Constitutional: She is oriented to person, place, and time. She appears well-developed and well-nourished. No distress.  HENT:  Head: Normocephalic and atraumatic.  Mouth/Throat: No oropharyngeal exudate.  Eyes: Conjunctivae and EOM are normal. No scleral icterus.  Neck: Normal range of motion. Neck supple.  Cardiovascular: Normal rate and regular rhythm.   Pulmonary/Chest: Effort normal. No respiratory distress. She has no wheezes.  Abdominal: She exhibits no distension.  Musculoskeletal: She exhibits no edema or tenderness.  Neurological: She is alert and oriented to person, place, and time. She exhibits normal muscle tone. Coordination normal.  Skin: Skin is warm and dry. No rash noted. She is not diaphoretic. No erythema. No pallor.  Psychiatric: She has a normal mood and affect. Her behavior is normal. Judgment and thought content normal.          Assessment & Plan:  HSV recurrent: continue increased dose of valtrex to 1g in am and 500mg  in the pm. I am becoming a bit skeptical of whether or not this really is HSV. Will check BMP w gfr today  If she has florid outbreak we could send sample for HSV PCR  She can otherwise continue with current regimen and followup PRN

## 2014-04-14 ENCOUNTER — Encounter: Payer: Self-pay | Admitting: Internal Medicine

## 2014-04-14 ENCOUNTER — Ambulatory Visit (INDEPENDENT_AMBULATORY_CARE_PROVIDER_SITE_OTHER): Payer: Medicare Other | Admitting: Internal Medicine

## 2014-04-14 VITALS — BP 104/68 | HR 58 | Temp 97.9°F | Resp 16 | Ht 59.75 in | Wt 126.5 lb

## 2014-04-14 DIAGNOSIS — H9193 Unspecified hearing loss, bilateral: Secondary | ICD-10-CM

## 2014-04-14 DIAGNOSIS — R21 Rash and other nonspecific skin eruption: Secondary | ICD-10-CM | POA: Diagnosis not present

## 2014-04-14 DIAGNOSIS — E038 Other specified hypothyroidism: Secondary | ICD-10-CM

## 2014-04-14 DIAGNOSIS — E034 Atrophy of thyroid (acquired): Secondary | ICD-10-CM | POA: Diagnosis not present

## 2014-04-14 DIAGNOSIS — H9190 Unspecified hearing loss, unspecified ear: Secondary | ICD-10-CM | POA: Diagnosis not present

## 2014-04-14 DIAGNOSIS — L309 Dermatitis, unspecified: Secondary | ICD-10-CM

## 2014-04-14 DIAGNOSIS — R928 Other abnormal and inconclusive findings on diagnostic imaging of breast: Secondary | ICD-10-CM

## 2014-04-14 DIAGNOSIS — Z1239 Encounter for other screening for malignant neoplasm of breast: Secondary | ICD-10-CM | POA: Diagnosis not present

## 2014-04-14 LAB — HM PAP SMEAR: HM PAP: NORMAL

## 2014-04-14 NOTE — Patient Instructions (Signed)
Referrals for mammogram, dermatology and hearing test are in process   We'll see you again in 6  Months

## 2014-04-14 NOTE — Progress Notes (Signed)
Patient ID: Courtney Jensen, female   DOB: [birth year], 69 y.o.   MRN: [id number]    Patient Active Problem List   Diagnosis Date Noted  . Hearing loss 04/15/2014  . Eczema   . 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 Herpes simplex disease 05/27/2010  . DERMATITIS, ATOPIC 12/23/2008  . HIP PAIN, BILATERAL 03/25/2008  . Abnormal mammogram 01/23/2008  . Hypothyroidism 12/04/2007  . PRURITUS, VAGINAL 10/29/2007  . HYPERLIPIDEMIA 08/21/2007  . ALLERGIC RHINITIS 08/21/2007  . MENOPAUSAL SYNDROME 08/21/2007  . ECZEMA 08/21/2007  . OSTEOPENIA 08/21/2007    Subjective:   1) hypothyroidism managed with armour due to  Side effects of itching with levothyroxine.  Diagnosed 6 yrs. ago  2) Genital herpes,  Has a severe case monthly, used valtrex prn monthly.    Sees Dr.  Drucilla Schmidt,  Valtrex dose was increased to a  higher dose daily with good results.  Since she has started a [employer] she has noted an improvement  And  has lost 18 lbs in the last 2 months.  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  She got sick of working out in a gym  She walks and rides a bicycle,  [employer] averages 10K to 20K steps daily . Using 25 mg zoloft as well.  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  Uses motrin prn  Has a right thumb ganglionic cyst. 4) ) Scaling lesion on left cheek and left ear,  gradually enlarging, needs dermatology referral , also  Has recurrent epiosdes of intensely pruritic rash on nape of neck and shoulders.    5) Not sleeping more than 4 or 5 hours per ight per fit bit,  Due to congestion with position change,  Using 1/2 ambien per night for years.  Goes to bed at 11 or 12,  Using i pad every night sometimes until bedtime   6) Hearing loss        Past Medical History  Diagnosis Date  . Recurrent herpes simplex   . Eczema   . Osteoporosis   . Thyroid  disease   . Anxiety   . Depression        @ALL @  Past Surgical History  Procedure Laterality Date  . Carpal tunnel release Bilateral   . Abdominal hysterectomy      History   Social History  . Marital Status: Married    Spouse Name: N/A  . Number of Children: N/A  . Years of Education: N/A   Occupational History  . Not on file.   Social History Main Topics  . Smoking status: Former Smoker -- 0.50 packs/day    Types: Cigarettes    Quit date: 04/13/1984  . Smokeless tobacco: Not on file  . Alcohol Use: 0.0 oz/week    0 Standard drinks or equivalent per week     Comment: Wine or mixed drink in the evening  . Drug Use: No  . Sexual Activity: No   Other Topics Concern  . Not on file   Social History Narrative    @FAMH @       Review of Systems:   The rest of the review of systems was negative except those addressed in the HPI.      Objective:  BP 104/68 mmHg  Pulse 58  Temp(Src) 97.9 F (36.6 C) (Oral)  Resp 16  Ht 4' 11.75" (1.518 m)  Wt 126 lb 8 oz (57.38 kg)  BMI 24.90 kg/m2  SpO2 98%  General appearance: alert, cooperative and appears stated age Ears: normal TM's and external ear canals both ears Throat: lips, mucosa, and tongue normal; teeth and gums normal Neck: no adenopathy, no carotid bruit, supple, symmetrical, trachea midline and thyroid not enlarged, symmetric, no tenderness/mass/nodules Back: symmetric, no curvature. ROM normal. No CVA tenderness. Lungs: clear to auscultation bilaterally Heart: regular rate and rhythm, S1, S2 normal, no murmur, click, rub or gallop Abdomen: soft, non-tender; bowel sounds normal; no masses,  no organomegaly Pulses: 2+ and symmetric Skin: Skin color, texture, turgor normal. No rashes or lesions Lymph nodes: Cervical, supraclavicular, and axillary nodes normal.  Assessment and Plan:  Hypothyroidism Managed with armour by Endocrinology.   Eczema Referral to dermatology requested.     Hearing loss Referral to Audiology for hearing test   Abnormal mammogram She is due for annual mammogram.     Updated Medication List Outpatient Encounter Prescriptions as of 04/14/2014  Medication Sig  . Cholecalciferol (VITAMIN D3) 1000000 UNIT/GM LIQD 10,000 Units by Does not apply route daily.  . sertraline (ZOLOFT) 50 MG tablet   . thyroid (ARMOUR THYROID) 60 MG tablet Take 60 mg by mouth daily.  . valACYclovir (VALTREX) 500 MG tablet Take 1 tablet (500 mg total) by mouth as directed. 2 tablets in AM, 1 at night  . zolpidem (AMBIEN) 10 MG tablet      Orders Placed This Encounter  Procedures  . HM MAMMOGRAPHY  . HM DEXA SCAN  . MM DIGITAL SCREENING BILATERAL  . HM PAP SMEAR  . Ambulatory referral to Dermatology  . Ambulatory referral to Audiology  . HM COLONOSCOPY    No Follow-up on file.

## 2014-04-15 ENCOUNTER — Encounter: Payer: Self-pay | Admitting: Internal Medicine

## 2014-04-15 DIAGNOSIS — H9190 Unspecified hearing loss, unspecified ear: Secondary | ICD-10-CM | POA: Insufficient documentation

## 2014-04-15 NOTE — Assessment & Plan Note (Signed)
She is due for annual mammogram.

## 2014-04-15 NOTE — Assessment & Plan Note (Signed)
Referral to Audiology for hearing test

## 2014-04-15 NOTE — Assessment & Plan Note (Signed)
Managed with armour by Endocrinology.

## 2014-04-15 NOTE — Assessment & Plan Note (Signed)
Referral to dermatology requested.

## 2014-05-12 DIAGNOSIS — H9319 Tinnitus, unspecified ear: Secondary | ICD-10-CM | POA: Diagnosis not present

## 2014-05-12 DIAGNOSIS — H903 Sensorineural hearing loss, bilateral: Secondary | ICD-10-CM | POA: Diagnosis not present

## 2014-06-19 DIAGNOSIS — I788 Other diseases of capillaries: Secondary | ICD-10-CM | POA: Diagnosis not present

## 2014-06-19 DIAGNOSIS — X32XXXA Exposure to sunlight, initial encounter: Secondary | ICD-10-CM | POA: Diagnosis not present

## 2014-06-19 DIAGNOSIS — D224 Melanocytic nevi of scalp and neck: Secondary | ICD-10-CM | POA: Diagnosis not present

## 2014-06-19 DIAGNOSIS — L570 Actinic keratosis: Secondary | ICD-10-CM | POA: Diagnosis not present

## 2014-06-19 DIAGNOSIS — L578 Other skin changes due to chronic exposure to nonionizing radiation: Secondary | ICD-10-CM | POA: Diagnosis not present

## 2014-07-04 DIAGNOSIS — E039 Hypothyroidism, unspecified: Secondary | ICD-10-CM | POA: Diagnosis not present

## 2014-07-28 ENCOUNTER — Other Ambulatory Visit: Payer: Self-pay

## 2014-09-01 ENCOUNTER — Ambulatory Visit (INDEPENDENT_AMBULATORY_CARE_PROVIDER_SITE_OTHER)
Admission: RE | Admit: 2014-09-01 | Discharge: 2014-09-01 | Disposition: A | Discharge status: Home / Self Care | Payer: Medicare Other | Source: Ambulatory Visit | Attending: Nurse Practitioner | Admitting: Nurse Practitioner

## 2014-09-01 ENCOUNTER — Telehealth: Payer: Self-pay

## 2014-09-01 ENCOUNTER — Ambulatory Visit (INDEPENDENT_AMBULATORY_CARE_PROVIDER_SITE_OTHER): Payer: Medicare Other | Admitting: Nurse Practitioner

## 2014-09-01 VITALS — BP 130/78 | HR 51 | Temp 98.4°F | Resp 14 | Ht 59.75 in | Wt 116.8 lb

## 2014-09-01 DIAGNOSIS — M25512 Pain in left shoulder: Secondary | ICD-10-CM | POA: Diagnosis not present

## 2014-09-01 MED ORDER — CYCLOBENZAPRINE HCL 5 MG PO TABS: 5.0000 mg | ORAL_TABLET | Freq: Every day | ORAL | Status: DC

## 2014-09-01 NOTE — Telephone Encounter (Signed)
PA approved.

## 2014-09-01 NOTE — Progress Notes (Signed)
Pre visit review using our clinic review tool, if applicable. No additional management support is needed unless otherwise documented below in the visit note. 

## 2014-09-01 NOTE — Progress Notes (Signed)
Subjective:    Patient ID: Courtney Jensen, female    DOB: 1945/10/07, 69 y.o.   MRN: [id number]  HPI  Courtney Jensen is a 69 yo female with a CC of shoulder pain.   1) Approx. 08/15/14 fell on left shoulder[place]  [place], grabbed rail spun and fell onto left shoulder, limited ROM, picking up dog bowel is painful to lift. Buttoning is painful. Pressure of bra strap hurts. Characteristics- throbbing, aching   Onset- 08/15/14 approx Location- lateral side of arm most painful  Duration - worsens by night time Characteristics- Throbbing aching Aggravating factors- lifting objects, buttoning, lifting arm  Relieving factors- ice, ASA helpful somewhat  Severity- 5-7/10   Ice right away- helpful  Tylenol and ibuprofen- helpful   Review of Systems  Constitutional: Negative for fever, chills, diaphoresis and fatigue.  Respiratory: Negative for chest tightness, shortness of breath and wheezing.   Cardiovascular: Negative for chest pain, palpitations and leg swelling.  Musculoskeletal: Positive for myalgias and arthralgias. Negative for back pain, joint swelling, gait problem, neck pain and neck stiffness.  Skin: Negative for rash.  Neurological: Negative for dizziness, weakness, numbness and headaches.  Hematological: Does not bruise/bleed easily.   Past Medical History  Diagnosis Date  . Recurrent herpes simplex   . Eczema   . Osteoporosis   . Thyroid disease   . Anxiety   . Depression     History   Social History  . Marital Status: Married    Spouse Name: N/A  . Number of Children: N/A  . Years of Education: N/A   Occupational History  . Not on file.   Social History Main Topics  . Smoking status: Former Smoker -- 0.50 packs/day    Types: Cigarettes    Quit date: 04/13/1984  . Smokeless tobacco: Not on file  . Alcohol Use: 0.0 oz/week    0 Standard drinks or equivalent per week     Comment: Wine or mixed drink in the evening  . Drug  Use: No  . Sexual Activity: No   Other Topics Concern  . Not on file   Social History Narrative    Past Surgical History  Procedure Laterality Date  . Carpal tunnel release Bilateral   . Abdominal hysterectomy      Family History  Problem Relation Age of Onset  . Arthritis Mother   . Heart disease Father   . Hypertension Father     Allergies  Allergen Reactions  . Codeine     Current Outpatient Prescriptions on File Prior to Visit  Medication Sig Dispense Refill  . Cholecalciferol (VITAMIN D3) 1000000 UNIT/GM LIQD 10,000 Units by Does not apply route daily.    . sertraline (ZOLOFT) 50 MG tablet     . thyroid (ARMOUR THYROID) 60 MG tablet Take 60 mg by mouth daily.    . valACYclovir (VALTREX) 500 MG tablet Take 1 tablet (500 mg total) by mouth as directed. 2 tablets in AM, 1 at night 270 tablet 4  . zolpidem (AMBIEN) 10 MG tablet      No current facility-administered medications on file prior to visit.      Objective:   Physical Exam  Constitutional: She is oriented to person, place, and time. She appears well-developed and well-nourished. No distress.  BP 130/78 mmHg  Pulse 51  Temp(Src) 98.4 F (36.9 C)  Resp 14  Ht 4' 11.75" (1.518 m)  Wt 116 lb 12.8 oz (52.98 kg)  BMI 22.99 kg/m2  SpO2 99%   HENT:  Head: Normocephalic and atraumatic.  Right Ear: External ear normal.  Left Ear: External ear normal.  Musculoskeletal: She exhibits tenderness. She exhibits no edema.  Neurological: She is alert and oriented to person, place, and time. No cranial nerve deficit. She exhibits normal muscle tone. Coordination normal.  Skin: Skin is warm and dry. No rash noted. She is not diaphoretic.  Psychiatric: She has a normal mood and affect. Her behavior is normal. Judgment and thought content normal.      Assessment & Plan:

## 2014-09-01 NOTE — Telephone Encounter (Signed)
PA started for Cyclobenzaprine HCL.  Awaiting results.  Will follow up!

## 2014-09-01 NOTE — Patient Instructions (Signed)
Flexeril at night - watch for drowsiness with ambien.   Ice as needed, resting the arm is advised. We will follow up after results.

## 2014-09-04 ENCOUNTER — Telehealth: Payer: Self-pay

## 2014-09-04 NOTE — Telephone Encounter (Signed)
Patient states that she is out of town and will give [place] a call back after she checks her schedule.

## 2014-09-04 NOTE — Telephone Encounter (Signed)
Patient would like a call back in reference to her recent shoulder x-ray. Patient states that she does not have good cell phone reception where she is, so she would appreciate it if we left a message on her phone explaining these results.

## 2014-09-04 NOTE — Telephone Encounter (Signed)
Sorry I sent a MyChart (I forgot she wanted a call). She had no fracture or dislocation. 1 of the joints of the shoulder has some degeneration, which can cause discomfort, but not all of her symptoms specifically. Next step is MRI. Ask her if she would like to proceed.

## 2014-09-08 ENCOUNTER — Encounter: Payer: Self-pay | Admitting: Nurse Practitioner

## 2014-09-08 DIAGNOSIS — M25512 Pain in left shoulder: Secondary | ICD-10-CM | POA: Insufficient documentation

## 2014-09-08 NOTE — Assessment & Plan Note (Signed)
Will obtain x-ray left shoulder to see if she had any fx or dislocation. Will try flexeril, RICE, and follow up after x-ray.

## 2014-09-09 ENCOUNTER — Telehealth: Payer: Self-pay | Admitting: Internal Medicine

## 2014-09-09 DIAGNOSIS — M25512 Pain in left shoulder: Secondary | ICD-10-CM

## 2014-09-09 NOTE — Telephone Encounter (Signed)
Pt called needing to schedule for a MRI. I advised pt that the MRI would not be done at the office. Pt already got the Xray done. Have a great day!

## 2014-09-09 NOTE — Telephone Encounter (Signed)
patient saw Lorane Gell for shoulder pain.  I have ordered the MRI,  She will need to follow up with me to discuss

## 2014-09-09 NOTE — Addendum Note (Signed)
Addended by: Crecencio Mc on: 09/09/2014 09:00 AM   Modules accepted: Orders

## 2014-09-09 NOTE — Telephone Encounter (Signed)
Left message on VM to return call 

## 2014-09-11 ENCOUNTER — Telehealth: Payer: Self-pay | Admitting: *Deleted

## 2014-09-11 NOTE — Telephone Encounter (Signed)
Patient has a MRI scheduled for Monday morning, Patient has requested a valium before appt. Thanks

## 2014-09-12 MED ORDER — DIAZEPAM 10 MG PO TABS: ORAL_TABLET | ORAL | Status: DC

## 2014-09-12 NOTE — Telephone Encounter (Signed)
Pt called back. Gave pt the instruction as listed below. Rx fax? Can call pt back if desired and inform that fax has been sent. (Pt cell # [phone]).

## 2014-09-12 NOTE — Telephone Encounter (Signed)
Script called to pharmacy and patient notified and given direction for use a second time.

## 2014-09-12 NOTE — Telephone Encounter (Signed)
Courtney Jensen,  Please discuss the valium dose with patient  If she does not recall the dose she has taken before,  Have  her start with 1/2 tablet  One hour before the procedure as directed on the rx I wrote,  If she has taken 10 mg before she can use the full tablet.  She will need to have someone drive her to the MRI since she is using vALIUM

## 2014-09-15 ENCOUNTER — Ambulatory Visit
Admission: RE | Admit: 2014-09-15 | Discharge: 2014-09-15 | Disposition: A | Discharge status: Home / Self Care | Payer: Medicare Other | Source: Ambulatory Visit | Attending: Internal Medicine | Admitting: Internal Medicine

## 2014-09-15 DIAGNOSIS — M19012 Primary osteoarthritis, left shoulder: Secondary | ICD-10-CM | POA: Insufficient documentation

## 2014-09-15 DIAGNOSIS — M25512 Pain in left shoulder: Secondary | ICD-10-CM | POA: Diagnosis present

## 2014-09-15 DIAGNOSIS — S46812A Strain of other muscles, fascia and tendons at shoulder and upper arm level, left arm, initial encounter: Secondary | ICD-10-CM | POA: Diagnosis not present

## 2014-09-15 DIAGNOSIS — M75102 Unspecified rotator cuff tear or rupture of left shoulder, not specified as traumatic: Secondary | ICD-10-CM | POA: Diagnosis not present

## 2014-09-17 ENCOUNTER — Encounter: Payer: Self-pay | Admitting: Internal Medicine

## 2014-09-24 ENCOUNTER — Ambulatory Visit (INDEPENDENT_AMBULATORY_CARE_PROVIDER_SITE_OTHER): Payer: Medicare Other | Admitting: Internal Medicine

## 2014-09-24 ENCOUNTER — Encounter: Payer: Self-pay | Admitting: Internal Medicine

## 2014-09-24 VITALS — BP 100/58 | HR 66 | Temp 98.3°F | Resp 12 | Ht 60.0 in | Wt 118.2 lb

## 2014-09-24 DIAGNOSIS — M12512 Traumatic arthropathy, left shoulder: Secondary | ICD-10-CM

## 2014-09-24 DIAGNOSIS — M25559 Pain in unspecified hip: Secondary | ICD-10-CM | POA: Diagnosis not present

## 2014-09-24 DIAGNOSIS — M12812 Other specific arthropathies, not elsewhere classified, left shoulder: Secondary | ICD-10-CM

## 2014-09-24 DIAGNOSIS — M75102 Unspecified rotator cuff tear or rupture of left shoulder, not specified as traumatic: Principal | ICD-10-CM

## 2014-09-24 MED ORDER — MELOXICAM 15 MG PO TABS: 15.0000 mg | ORAL_TABLET | Freq: Every day | ORAL | Status: DC

## 2014-09-24 MED ORDER — OMEPRAZOLE 40 MG PO CPDR
40.0000 mg | DELAYED_RELEASE_CAPSULE | Freq: Every day | ORAL | Status: DC
Start: 2014-09-24 — End: 2014-10-27

## 2014-09-24 NOTE — Progress Notes (Signed)
Pre-visit discussion using our clinic review tool. No additional management support is needed unless otherwise documented below in the visit note.  

## 2014-09-24 NOTE — Progress Notes (Signed)
Subjective:  Patient ID: Courtney Jensen, female    DOB: May 22, 1945  Age: 69 y.o. MRN: [id number]  CC: The primary encounter diagnosis was Left rotator cuff tear arthropathy. A diagnosis of Pain in joint, pelvic region and thigh, unspecified laterality was also pertinent to this visit.  HPI Bera Pinela presents for follow up on MRI of left shoulder done one week ago for evaluation of persistent pain following a fall.  She has large F/T tear and retraction of the supraspinatus and subscapularis tendons, as well as a torn long head of biceps tendon .   She has not read her mychart message from august 17th advising surgical referral   [place] MRI . [place], and fell onto same shoulder .  Had a knot on her left hip  For a while .  Right hip bothering her the most,  Lateral hip pain radiates down to the lateral knee usually not below.  Pain is sharp , intermittent brought on by leaning onto right hip or lying down on hip   Cannot tolerate any steroid injections due to recurrent outbreaks   Has been taking 2 F/s aspirin  Up to 3 times daily    Outpatient Prescriptions Prior to Visit  Medication Sig Dispense Refill  . Cholecalciferol (VITAMIN D3) 1000000 UNIT/GM LIQD 10,000 Units by Does not apply route daily.    . cyclobenzaprine (FLEXERIL) 5 MG tablet Take 1 tablet (5 mg total) by mouth at bedtime. 30 tablet 0  . diazepam (VALIUM) 10 MG tablet 1/2  To 1 tablet 30 one hour prior to MRI 5 tablet 0  . sertraline (ZOLOFT) 50 MG tablet     . thyroid (ARMOUR THYROID) 60 MG tablet Take 60 mg by mouth daily.    . valACYclovir (VALTREX) 500 MG tablet Take 1 tablet (500 mg total) by mouth as directed. 2 tablets in AM, 1 at night 270 tablet 4  . zolpidem (AMBIEN) 10 MG tablet      No facility-administered medications prior to visit.    Review of Systems;  Patient denies headache, fevers, malaise, unintentional  weight loss, skin rash, eye pain, sinus congestion and sinus pain, sore throat, dysphagia,  hemoptysis , cough, dyspnea, wheezing, chest pain, palpitations, orthopnea, edema, abdominal pain, nausea, melena, diarrhea, constipation, flank pain, dysuria, hematuria, urinary  Frequency, nocturia, numbness, tingling, seizures,  Focal weakness, Loss of consciousness,  Tremor, insomnia, depression, anxiety, and suicidal ideation.      Objective:  BP 100/58 mmHg  Pulse 66  Temp(Src) 98.3 F (36.8 C) (Oral)  Resp 12  Ht 5' (1.524 m)  Wt 118 lb 4 oz (53.638 kg)  BMI 23.09 kg/m2  SpO2 98%  BP Readings from Last 3 Encounters:  09/24/14 100/58  09/01/14 130/78  04/14/14 104/68    Wt Readings from Last 3 Encounters:  09/24/14 118 lb 4 oz (53.638 kg)  09/15/14 116 lb (52.617 kg)  09/01/14 116 lb 12.8 oz (52.98 kg)    General appearance: alert, cooperative and appears stated age Neck: no adenopathy, no carotid bruit, supple, symmetrical, trachea midline and thyroid not enlarged, symmetric, no tenderness/mass/nodules Back: symmetric, no curvature. ROM normal. No CVA tenderness. Lungs: clear to auscultation bilaterally Heart: regular rate and rhythm, S1, S2 normal, no murmur, click, rub or gallop Abdomen: soft, non-tender; bowel sounds normal; no masses,  no organomegaly Pulses: 2+ and symmetric Skin: Skin color, texture, turgor normal. No rashes or lesions MSK:  left shoulder ROM limited by pain to abduction to 180 degrees and flexion to 180 degrees  No results found for: HGBA1C  Lab Results  Component Value Date   CREATININE 0.76 01/20/2014   CREATININE 0.79 11/17/2011   CREATININE 1.0 11/26/2007    Lab Results  Component Value Date   WBC 4.4* 11/26/2007   HGB 13.7 11/26/2007   HCT 39.8 11/26/2007   PLT 163 11/26/2007   GLUCOSE 85 01/20/2014   CHOL 208* 11/26/2007   TRIG 54 11/26/2007   HDL 78.4 11/26/2007   LDLDIRECT 102.6 11/26/2007   ALT 26 11/26/2007   AST 38* 11/26/2007     NA 139 01/20/2014   K 3.9 01/20/2014   CL 106 01/20/2014   CREATININE 0.76 01/20/2014   BUN 14 01/20/2014   CO2 25 01/20/2014   TSH 6.30* 03/24/2008    Mr Shoulder Left Wo Contrast  09/15/2014   CLINICAL DATA:  [place] on July 15th and injured shoulder. Persistent pain and limited range of motion.  EXAM: MRI OF THE LEFT SHOULDER WITHOUT CONTRAST  TECHNIQUE: Multiplanar, multisequence MR imaging of the shoulder was performed. No intravenous contrast was administered.  COMPARISON:  Radiographs 09/01/2014  FINDINGS: Rotator cuff: Large full-thickness retracted tears involving the supraspinatus and subscapularis tendons. Both tendons are retracted approximately 3 cm. Infraspinatus tendinopathy but no partial or full-thickness tear.  Muscles: Edema, fluid and hemorrhage extending back into the supraspinatus and subscapularis muscles.  Biceps long head:  Torn and retracted.  Acromioclavicular Joint: Mild AC joint degenerative changes. The acromion is type 2 in shape. No significant lateral downsloping or undersurface spurring.  Glenohumeral Joint: Mild degenerative changes with moderate-sized joint effusion and mild synovitis.  Labrum:  No obvious labral tear.  Bones:  No acute bony findings.  IMPRESSION: 1. Large full-thickness retracted tears of the supraspinatus and subscapularis tendons. The infraspinatus tendon is intact. 2. Torn and retracted long head biceps tendon. 3. No significant findings for bony impingement. 4. Mild AC joint and glenohumeral joint degenerative changes.   Electronically Signed   By: Marijo Sanes M.D.   On: 09/15/2014 13:44    Assessment & Plan:   Problem List Items Addressed This Visit      Unprioritized   HIP PAIN, BILATERAL    Bilateral, right greater than left.  Trial of meloxicam and plain x rays offered.      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   Discussed treatment with surgery vs PT given her age and minimal loss of function.  She prefers to avoid surgery .  Meloxicam and tylenol prescribed ,  aspirn in excess of one daly stopped.        A total of 25 minutes of face to face time was spent with patient more than half of which was spent in counselling about the above mentioned conditions  and coordination of care   I am having Ms. Redfield start on omeprazole and meloxicam. I am also having her maintain her zolpidem, sertraline, thyroid, valACYclovir, Vitamin D3, cyclobenzaprine, and diazepam.  Meds ordered this encounter  Medications  . omeprazole (PRILOSEC) 40 MG capsule    Sig: Take 1 capsule (40 mg total) by mouth daily.    Dispense:  30 capsule    Refill:  3  . meloxicam (MOBIC) 15 MG tablet    Sig: Take 1 tablet (15 mg total) by mouth  daily.    Dispense:  30 tablet    Refill:  3    There are no discontinued medications.  Follow-up: Return in about 4 weeks (around 10/22/2014).   Crecencio Mc, MD

## 2014-09-24 NOTE — Patient Instructions (Addendum)
I am prescribing meloxicam 15 mg daily as an NSAID along with omeprazole 40 mg daily for stomach protection. If you cannot fill the meloxicam today,  You can use aleve 2 tablets daily with breakfast  ,  And buy OTC prolosec (generic) 2 capsules daily with coffee in the am .  OK TO TAKE UP TO 2000 MG OF TYLENOL ALONG WITH THE NSAID  FOR PAIN CONTROL   STOP TAKING ASPIRIN IN EXCESS OF ONE DAILY!!    Rotator Cuff Injury Rotator cuff injury is any type of injury to the set of muscles and tendons that make up the stabilizing unit of your shoulder. This unit holds the ball of your upper arm bone (humerus) in the socket of your shoulder blade (scapula).  CAUSES Injuries to your rotator cuff most commonly come from sports or activities that cause your arm to be moved repeatedly over your head. Examples of this include throwing, weight lifting, swimming, or racquet sports. Long lasting (chronic) irritation of your rotator cuff can cause soreness and swelling (inflammation), bursitis, and eventual damage to your tendons, such as a tear (rupture). SIGNS AND SYMPTOMS Acute rotator cuff tear:  Sudden tearing sensation followed by severe pain shooting from your upper shoulder down your arm toward your elbow.  Decreased range of motion of your shoulder because of pain and muscle spasm.  Severe pain.  Inability to raise your arm out to the side because of pain and loss of muscle power (large tears). Chronic rotator cuff tear:  Pain that usually is worse at night and may interfere with sleep.  Gradual weakness and decreased shoulder motion as the pain worsens.  Decreased range of motion. Rotator cuff tendinitis:  Deep ache in your shoulder and the outside upper arm over your shoulder.  Pain that comes on gradually and becomes worse when lifting your arm to the side or turning it inward. DIAGNOSIS Rotator cuff injury is diagnosed through a medical history, physical exam, and imaging exam. The  medical history helps determine the type of rotator cuff injury. Your health care provider will look at your injured shoulder, feel the injured area, and ask you to move your shoulder in different positions. X-ray exams typically are done to rule out other causes of shoulder pain, such as fractures. MRI is the exam of choice for the most severe shoulder injuries because the images show muscles and tendons.  TREATMENT  Chronic tear:  Medicine for pain, such as acetaminophen or ibuprofen.  Physical therapy and range-of-motion exercises may be helpful in maintaining shoulder function and strength.  Steroid injections into your shoulder joint.  Surgical repair of the rotator cuff if the injury does not heal with noninvasive treatment. Acute tear:  Anti-inflammatory medicines such as ibuprofen and naproxen to help reduce pain and swelling.  A sling to help support your arm and rest your rotator cuff muscles. Long-term use of a sling is not advised. It may cause significant stiffening of the shoulder joint.  Surgery may be considered within a few weeks, especially in younger, active people, to return the shoulder to full function.  Indications for surgical treatment include the following:  Age younger than 26 years.  Rotator cuff tears that are complete.  Physical therapy, rest, and anti-inflammatory medicines have been used for 6-8 weeks, with no improvement.  Employment or sporting activity that requires constant shoulder use. Tendinitis:  Anti-inflammatory medicines such as ibuprofen and naproxen to help reduce pain and swelling.  A sling to help support  your arm and rest your rotator cuff muscles. Long-term use of a sling is not advised. It may cause significant stiffening of the shoulder joint.  Severe tendinitis may require:  Steroid injections into your shoulder joint.  Physical therapy.  Surgery. HOME CARE INSTRUCTIONS   Apply ice to your injury:  Put ice in a plastic  bag.  Place a towel between your skin and the bag.  Leave the ice on for 20 minutes, 2-3 times a day.  If you have a shoulder immobilizer (sling and straps), wear it until told otherwise by your health care provider.  You may want to sleep on several pillows or in a recliner at night to lessen swelling and pain.  Only take over-the-counter or prescription medicines for pain, discomfort, or fever as directed by your health care provider.  Do simple hand squeezing exercises with a soft rubber ball to decrease hand swelling. SEEK MEDICAL CARE IF:   Your shoulder pain increases, or new pain or numbness develops in your arm, hand, or fingers.  Your hand or fingers are colder than your other hand. SEEK IMMEDIATE MEDICAL CARE IF:   Your arm, hand, or fingers are numb or tingling.  Your arm, hand, or fingers are increasingly swollen and painful, or they turn white or blue. MAKE SURE YOU:  Understand these instructions.  Will watch your condition.  Will get help right away if you are not doing well or get worse. Document Released: 01/15/2000 Document Revised: 01/22/2013 Document Reviewed: 08/29/2012 [facility] Patient Information 2015 [place], [place]. This information is not intended to replace advice given to you by your health care provider. Make sure you discuss any questions you have with your health care provider.     Rotator cuff Surgery for Rotator Cuff Tear with Rehab Rotator cuff surgery is only recommended for individuals who have experienced persistent disability for greater than 3 months of non-surgical (conservative) treatment. Surgery is not necessary but is recommended for individuals who experience difficulty completing daily activities or athletes who are unable to compete. Rotator cuff tears do not usually heal without surgical intervention. If left alone small rotator cuff tears usually become larger. Younger athletes who have a rotator cuff tear may be recommended for  surgery without attempting conservative rehabilitation. The purpose of surgery is to regain function of the shoulder joint and eliminate pain associated with the injury. In addition to repairing the tendon tear, the surgery often removes a portion of the bony roof of the shoulder (acromion) as well as the chronically thickened and inflamed membrane below the acromion (subacromial bursa). REASONS NOT TO OPERATE   Infection of the shoulder.  Inability to complete a rehabilitation program.  Patients who have other conditions (emotional or psychological) conditions that contribute to their shoulder condition. RISKS AND COMPLICATIONS  Infection.  Re-tear of the rotator cuff tendons or muscles.  Shoulder stiffness and/or weakness.  Inability to compete in athletics.  Acromioclavicular (AC) joint paint.  Risks of surgery: infection, bleeding, nerve damage, or damage to surrounding tissues. TECHNIQUE There are different surgical procedures used to treat rotator cuff tears. The type of procedure depends on the extent of injury as well as the surgeon's preference. All of the surgical techniques for rotator cuff tears have the same goal of repairing the torn tendon, removing part of the acromion, and removing the subacromial bursa. There are two main types of procedures: arthroscopic and open incision. Arthroscopic procedures are usually completed and you go home the same day as surgery (outpatient).  These procedures use multiple small incisions in which tools and a video camera are placed to work on the shoulder. An electric shaver removes the bursa, then a power burr shaves down the portion of the acromion that places pressure on the rotator cuff. Finally the rotator cuff is sewed (sutured) back to the humeral head. Open incision procedures require a larger incision. The deltoid muscle is detached from the acromion and a ligament in the shoulder (coracoacromial) is cut in order for the surgeon to  access the rotator cuff. The subacromial bursa is removed as well as part of the acromion to give the rotator cuff room to move freely. The torn tendon is then sutured to the humeral head. After the rotator cuff is repaired, then the deltoid is reattached and the incision is closed up.  RECOVERY   Post-operative care depends on the surgical technique and the preferences of your therapist.  Keep the wound clean and dry for the first 10 to 14 days after surgery.  Keep your shoulder and arm in the sling provided to you for as long as you have been instructed to.  You will be given pain medications by your caregiver.  Passive (without using muscles) shoulder movements may be begun immediately after surgery.  It is important to follow through with you rehabilitation program in order to have the best possible recovery. RETURN TO SPORTS   The rehabilitation period will depend on the sport and position you play as well as the success of the operation.  The minimum recovery period is 6 months.  You must have regained complete shoulder motion and strength before returning to sports. SEEK IMMEDIATE MEDICAL CARE IF:   Any medications produce adverse side effects.  Any complications from surgery occur:  Pain, numbness, or coldness in the extremity operated upon.  Discoloration of the nail beds (they become blue or gray) of the extremity operated upon.  Signs of infections (fever, pain, inflammation, redness, or persistent bleeding). EXERCISES  RANGE OF MOTION (ROM) AND STRETCHING EXERCISES - Rotator Cuff Tear, Surgery For These exercises may help you restore your elbow mobility once your physician has discontinued your immobilization period. Beginning these before your provider's approval may result in delayed healing. Your symptoms may resolve with or without further involvement from your physician, physical therapist or athletic trainer. While completing these exercises, remember:    Restoring tissue flexibility helps normal motion to return to the joints. This allows healthier, less painful movement and activity.  An effective stretch should be held for at least 30 seconds. A stretch should never be painful. You should only feel a gentle lengthening or release in the stretch. ROM - Pendulum   Bend at the waist so that your right / left arm falls away from your body. Support yourself with your opposite hand on a solid surface, such as a table or a countertop.  Your right / left arm should be perpendicular to the ground. If it is not perpendicular, you need to lean over farther. Relax the muscles in your right / left arm and shoulder as much as possible.  Gently sway your hips and trunk so they move your right / left arm without any use of your right / left shoulder muscles.  Progress your movements so that your right / left arm moves side to side, then forward and backward, and finally, both clockwise and counterclockwise.  Complete __________ repetitions in each direction. Many people use this exercise to relieve discomfort in their shoulder as  well as to gain range of motion. Repeat __________ times. Complete this exercise __________ times per day. STRETCH - Flexion, Seated   Sit in a firm chair so that your right / left forearm can rest on a table or on a table or countertop. Your right / left elbow should rest below the height of your shoulder so that your shoulder feels supported and not tense or uncomfortable.  Keeping your right / left shoulder relaxed, lean forward at your waist, allowing your right / left hand to slide forward. Bend forward until you feel a moderate stretch in your shoulder, but before you feel an increase in your pain.  Hold __________ seconds. Slowly return to your starting position. Repeat __________ times. Complete this exercise __________ times per day.  STRETCH - Flexion, Standing   Stand with good posture. With an underhand grip on  your right / left and an overhand grip on the opposite hand, grasp a broomstick or cane so that your hands are a little more than shoulder-width apart.  Keeping your right / left elbow straight and shoulder muscles relaxed, push the stick with your opposite hand to raise your right / left arm in front of your body and then overhead. Raise your arm until you feel a stretch in your right / left shoulder, but before you have increased shoulder pain.  Try to avoid shrugging your right / left shoulder as your arm rises by keeping your shoulder blade tucked down and toward your mid-back spine. Hold __________ seconds.  Slowly return to the starting position. Repeat __________ times. Complete this exercise __________ times per day.  STRETCH - Abduction, Supine   Stand with good posture. With an underhand grip on your right / left and an overhand grip on the opposite hand, grasp a broomstick or cane so that your hands are a little more than shoulder-width apart.  Keeping your right / left elbow straight and shoulder muscles relaxed, push the stick with your opposite hand to raise your right / left arm out to the side of your body and then overhead. Raise your arm until you feel a stretch in your right / left shoulder, but before you have increased shoulder pain.  Try to avoid shrugging your right / left shoulder as your arm rises by keeping your shoulder blade tucked down and toward your mid-back spine. Hold __________ seconds.  Slowly return to the starting position. Repeat __________ times. Complete this exercise __________ times per day.  ROM - Flexion, Active-Assisted  Lie on your back. You may bend your knees for comfort.  Grasp a broomstick or cane so your hands are about shoulder-width apart. Your right / left hand should grip the end of the stick/cane so that your hand is positioned "thumbs-up," as if you were about to shake hands.  Using your healthy arm to lead, raise your right / left arm  overhead until you feel a gentle stretch in your shoulder. Hold __________ seconds.  Use the stick/cane to assist in returning your right / left arm to its starting position. Repeat __________ times. Complete this exercise __________ times per day.  STRETCH - External Rotation   Tuck a folded towel or small ball under your right / left upper arm. Grasp a broomstick or cane with an underhand grasp a little more than shoulder width apart. Bend your elbows to 90 degrees.  Stand with good posture or sit in a chair without arms.  Use your strong arm to push the stick  across your body. Do not allow the towel or ball to fall. This will rotate your right / left arm away from your abdomen. Using the stick turn/rotate your hand and forearm away from your body. Hold __________ seconds. Repeat __________ times. Complete this exercise __________ times per day.  STRENGTHENING EXERCISES - Rotator Cuff Tear, Surgery For These exercises may help you begin to restore your elbow strength in the initial stage of your rehabilitation. Your physician will determine when you begin these exercises depending on the severity of your injury and the integrity of your repaired tissues. Beginning these before your provider's approval may result in delayed healing. While completing these exercises, remember:   Muscles can gain both the endurance and the strength needed for everyday activities through controlled exercises.  Complete these exercises as instructed by your physician, physical therapist or athletic trainer. Progress the resistance and repetitions only as guided.  You may experience muscle soreness or fatigue, but the pain or discomfort you are trying to eliminate should never worsen during these exercises. If this pain does worsen, stop and make certain you are following the directions exactly. If the pain is still present after adjustments, discontinue the exercise until you can discuss the trouble with your  clinician. STRENGTH - Shoulder Flexion, Isometric   With good posture and facing a wall, stand or sit about 4-6 inches away.  Keeping your right / left elbow straight, gently press the top of your fist into the wall. Increase the pressure gradually until you are pressing as hard as you can without shrugging your shoulder or increasing any shoulder discomfort.  Hold __________ seconds. Release the tension slowly. Relax your shoulder muscles completely before you do the next repetition. Repeat __________ times. Complete this exercise __________ times per day.  STRENGTH - Shoulder Abductors, Isometric   With good posture, stand or sit about 4-6 inches from a wall with your right / left side facing the wall.  Bend your right / left elbow. Gently press your right / left elbow into the wall. Increase the pressure gradually until you are pressing as hard as you can without shrugging your shoulder or increasing any shoulder discomfort.  Hold __________ seconds.  Release the tension slowly. Relax your shoulder muscles completely before you do the next repetition. Repeat __________ times. Complete this exercise __________ times per day.  STRENGTH - Internal Rotators, Isometric   Keep your right / left elbow at your side and bend it 90 degrees.  Step into a door frame so that the inside of your right / left wrist can press against the door frame without your upper arm leaving your side.  Gently press your right / left wrist into the door frame as if you were trying to draw the palm of your hand to your abdomen. Gradually increase the tension until you are pressing as hard as you can without shrugging your shoulder or increasing any shoulder discomfort.  Hold __________ seconds.  Release the tension slowly. Relax your shoulder muscles completely before you do the next repetition. Repeat __________ times. Complete this exercise __________ times per day.  STRENGTH - External Rotators, Isometric    Keep your right / left elbow at your side and bend it 90 degrees.  Step into a door frame so that the outside of your right / left wrist can press against the door frame without your upper arm leaving your side.  Gently press your right / left wrist into the door frame as if you  were trying to swing the back of your hand away from your abdomen. Gradually increase the tension until you are pressing as hard as you can without shrugging your shoulder or increasing any shoulder discomfort.  Hold __________ seconds.  Release the tension slowly. Relax your shoulder muscles completely before you do the next repetition. Repeat __________ times. Complete this exercise __________ times per day.  Document Released: 01/17/2005 Document Revised: 04/11/2011 Document Reviewed: 05/01/2008 [facility] Patient Information 2015 [place], [place]. This information is not intended to replace advice given to you by your health care provider. Make sure you discuss any questions you have with your health care provider.

## 2014-09-26 DIAGNOSIS — M12812 Other specific arthropathies, not elsewhere classified, left shoulder: Secondary | ICD-10-CM | POA: Insufficient documentation

## 2014-09-26 DIAGNOSIS — M75102 Unspecified rotator cuff tear or rupture of left shoulder, not specified as traumatic: Principal | ICD-10-CM

## 2014-09-26 NOTE — Assessment & Plan Note (Addendum)
By [facility], with complete tear of supraspinatus and subscapularis tendons along with log had of biceps tendon.  Discussed treatment with surgery vs PT given her age and minimal loss of function.  She prefers to avoid surgery .  Meloxicam and tylenol prescribed ,  aspirn in excess of one daly stopped.

## 2014-09-26 NOTE — Assessment & Plan Note (Signed)
Bilateral, right greater than left.  Trial of meloxicam and plain x rays offered.

## 2014-10-03 DIAGNOSIS — H1852 Epithelial (juvenile) corneal dystrophy: Secondary | ICD-10-CM | POA: Diagnosis not present

## 2014-10-03 DIAGNOSIS — H2513 Age-related nuclear cataract, bilateral: Secondary | ICD-10-CM | POA: Diagnosis not present

## 2014-10-03 DIAGNOSIS — H524 Presbyopia: Secondary | ICD-10-CM | POA: Diagnosis not present

## 2014-10-20 ENCOUNTER — Ambulatory Visit: Payer: Medicare Other | Admitting: Internal Medicine

## 2014-10-20 DIAGNOSIS — E039 Hypothyroidism, unspecified: Secondary | ICD-10-CM | POA: Diagnosis not present

## 2014-10-27 ENCOUNTER — Encounter: Payer: Self-pay | Admitting: Internal Medicine

## 2014-10-27 ENCOUNTER — Ambulatory Visit (INDEPENDENT_AMBULATORY_CARE_PROVIDER_SITE_OTHER): Payer: Medicare Other | Admitting: Internal Medicine

## 2014-10-27 ENCOUNTER — Encounter: Payer: Medicare Other | Admitting: Internal Medicine

## 2014-10-27 ENCOUNTER — Encounter: Payer: Self-pay | Admitting: *Deleted

## 2014-10-27 VITALS — BP 118/72 | HR 52 | Temp 98.3°F | Resp 12 | Ht 60.0 in | Wt 122.0 lb

## 2014-10-27 DIAGNOSIS — A6000 Herpesviral infection of urogenital system, unspecified: Secondary | ICD-10-CM | POA: Diagnosis not present

## 2014-10-27 DIAGNOSIS — M12812 Other specific arthropathies, not elsewhere classified, left shoulder: Secondary | ICD-10-CM

## 2014-10-27 DIAGNOSIS — Z0000 Encounter for general adult medical examination without abnormal findings: Secondary | ICD-10-CM

## 2014-10-27 DIAGNOSIS — Z79899 Other long term (current) drug therapy: Secondary | ICD-10-CM | POA: Diagnosis not present

## 2014-10-27 DIAGNOSIS — Z1239 Encounter for other screening for malignant neoplasm of breast: Secondary | ICD-10-CM

## 2014-10-27 DIAGNOSIS — M12512 Traumatic arthropathy, left shoulder: Secondary | ICD-10-CM

## 2014-10-27 DIAGNOSIS — R5383 Other fatigue: Secondary | ICD-10-CM

## 2014-10-27 DIAGNOSIS — E559 Vitamin D deficiency, unspecified: Secondary | ICD-10-CM | POA: Diagnosis not present

## 2014-10-27 DIAGNOSIS — M75101 Unspecified rotator cuff tear or rupture of right shoulder, not specified as traumatic: Secondary | ICD-10-CM

## 2014-10-27 DIAGNOSIS — E785 Hyperlipidemia, unspecified: Secondary | ICD-10-CM | POA: Diagnosis not present

## 2014-10-27 DIAGNOSIS — Z23 Encounter for immunization: Secondary | ICD-10-CM | POA: Diagnosis not present

## 2014-10-27 DIAGNOSIS — Z1159 Encounter for screening for other viral diseases: Secondary | ICD-10-CM | POA: Diagnosis not present

## 2014-10-27 DIAGNOSIS — M75102 Unspecified rotator cuff tear or rupture of left shoulder, not specified as traumatic: Secondary | ICD-10-CM

## 2014-10-27 DIAGNOSIS — E038 Other specified hypothyroidism: Secondary | ICD-10-CM

## 2014-10-27 DIAGNOSIS — E034 Atrophy of thyroid (acquired): Secondary | ICD-10-CM

## 2014-10-27 NOTE — Assessment & Plan Note (Signed)
By [facility], with complete tear of supraspinatus and subscapularis tendons along with long head of biceps tendon.  Discussed  Referral to orthopedic surgery  Today to Dr Roland Rack.

## 2014-10-27 NOTE — Assessment & Plan Note (Signed)

## 2014-10-27 NOTE — Assessment & Plan Note (Signed)
Managed with [facility] due to recurrent rash  with levothyroxine.

## 2014-10-27 NOTE — Progress Notes (Signed)
Pre-visit discussion using our clinic review tool. No additional management support is needed unless otherwise documented below in the visit note.  

## 2014-10-27 NOTE — Assessment & Plan Note (Signed)
Previously managed by ID

## 2014-10-27 NOTE — Progress Notes (Signed)
Patient ID: Courtney Jensen, female    DOB: 1945-04-04  Age: 69 y.o. MRN: [id number]  The patient is here for annual Medicare wellness examination and management of other chronic and acute problems.    Health Maintenance reviewed - mammogram ordered, patient to schedule appointment.  The risk factors are reflected in the social history.  The roster of all physicians providing medical care to patient - is listed in the Snapshot section of the chart.  Activities of daily living:  The patient is 100% independent in all ADLs: dressing, toileting, feeding as well as independent mobility  Home safety : The patient has smoke detectors in the home. They wear seatbelts.  There are no firearms at home. There is no violence in the home.   There is no risks for hepatitis, STDs or HIV. There is no   history of blood transfusion. They have no travel history to infectious disease endemic areas of the world.  The patient has seen their dentist in the last six month. They have seen their eye doctor in the last year. They admit to slight hearing difficulty with regard to whispered voices and some television programs.  They have deferred audiologic testing in the last year.  They do not  have excessive sun exposure. Discussed the need for sun protection: hats, long sleeves and use of sunscreen if there is significant sun exposure.   Diet: the importance of a healthy diet is discussed. They do have a healthy diet.  The benefits of regular aerobic exercise were discussed. She walks 4 times per week ,  20 minutes.   Depression screen: there are no signs or vegative symptoms of depression- irritability, change in appetite, anhedonia, sadness/tearfullness.  Cognitive assessment: the patient manages all their financial and personal affairs and is actively engaged. They could relate day,date,year and events; recalled 2/3 objects at 3 minutes; performed clock-face test normally.  The following portions of the patient's  history were reviewed and updated as appropriate: allergies, current medications, past family history, past medical history,  past surgical history, past social history  and problem list.  Visual acuity was not assessed per patient preference since she has regular follow up with her ophthalmologist. Hearing and body mass index were assessed and reviewed.   During the course of the visit the patient was educated and counseled about appropriate screening and preventive services including : fall prevention , diabetes screening, nutrition counseling, colorectal cancer screening, and recommended immunizations.    CC: The primary encounter diagnosis was Medicare annual wellness visit, subsequent. 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  Persistent pain and weakness of the left arm following 2 falls in late July . Patient opted foe medical management of full tears of supraspinatus , subscapularis and biceps tendons by recent MRI.  However she has had no appreciable improvement in range of motion in the past month.  History [place] has a past medical history of Recurrent herpes simplex; Eczema; Osteoporosis; Thyroid disease; Anxiety; and Depression.   She has past surgical history that includes Carpal tunnel release (Bilateral) and Abdominal hysterectomy.   Her family history includes Arthritis in her mother; Heart disease in her father; Hypertension in her father.She reports that  she quit smoking about 30 years ago. Her smoking use included Cigarettes. She smoked 0.50 packs per day. She does not have any  smokeless tobacco history on file. She reports that she drinks alcohol. She reports that she does not use illicit drugs.  Outpatient Prescriptions Prior to Visit  Medication Sig Dispense Refill  . Cholecalciferol (VITAMIN D3) 1000000 UNIT/GM LIQD 10,000 Units by Does not apply route daily.    . sertraline (ZOLOFT) 50 MG tablet     . thyroid (ARMOUR THYROID) 60 MG tablet Take 60 mg by mouth daily.    . valACYclovir (VALTREX) 500 MG tablet Take 1 tablet (500 mg total) by mouth as directed. 2 tablets in AM, 1 at night 270 tablet 4  . zolpidem (AMBIEN) 10 MG tablet     . cyclobenzaprine (FLEXERIL) 5 MG tablet Take 1 tablet (5 mg total) by mouth at bedtime. (Patient not taking: Reported on 10/27/2014) 30 tablet 0  . diazepam (VALIUM) 10 MG tablet 1/2  To 1 tablet 30 one hour prior to MRI (Patient not taking: Reported on 10/27/2014) 5 tablet 0  . meloxicam (MOBIC) 15 MG tablet Take 1 tablet (15 mg total) by mouth daily. (Patient not taking: Reported on 10/27/2014) 30 tablet 3  . omeprazole (PRILOSEC) 40 MG capsule Take 1 capsule (40 mg total) by mouth daily. 30 capsule 3   No facility-administered medications prior to visit.    Review of Systems   Patient denies headache, fevers, malaise, unintentional weight loss, skin rash, eye pain, sinus congestion and sinus pain, sore throat, dysphagia,  hemoptysis , cough, dyspnea, wheezing, chest pain, palpitations, orthopnea, edema, abdominal pain, nausea, melena, diarrhea, constipation, flank pain, dysuria, hematuria, urinary  Frequency, nocturia, numbness, tingling, seizures,  Focal weakness, Loss of consciousness,  Tremor, insomnia, depression, anxiety, and suicidal ideation.     Objective:  BP 118/72 mmHg  Pulse 52  Temp(Src) 98.3 F (36.8 C) (Oral)  Resp 12  Ht 5' (1.524 m)  Wt 122 lb (55.339 kg)  BMI 23.83 kg/m2  SpO2 97%  Physical Exam   General appearance: alert, cooperative and appears stated age Head: Normocephalic, without obvious  abnormality, atraumatic Eyes: conjunctivae/corneas clear. PERRL, EOM's intact. Fundi benign. Ears: normal TM's and external ear canals both ears Nose: Nares normal. Septum midline. Mucosa normal. No drainage or sinus tenderness. Throat: lips, mucosa, and tongue normal; teeth and gums normal Neck: no adenopathy, no carotid bruit, no JVD, supple, symmetrical, trachea midline and thyroid not enlarged, symmetric, no tenderness/mass/nodules Lungs: clear to auscultation bilaterally Breasts: normal appearance, left breast with resolving ecchymosis, no masses or tenderness Heart: regular rate and rhythm, S1, S2 normal, no murmur, click, rub or gallop Abdomen: soft, non-tender; bowel sounds normal; no masses,  no organomegaly Extremities: extremities normal, atraumatic, no cyanosis or edema Pulses: 2+ and symmetric Skin: Skin color, texture, turgor normal. No rashes or lesions Neurologic: Alert and oriented X 3, normal strength and tone. Normal symmetric reflexes. Normal coordination and gait.     Assessment & Plan:   Problem List Items Addressed This Visit    Hypothyroidism    Managed with Armour due to recurrent rash  with levothyroxine.       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  Discussed  Referral to orthopedic surgery  Today to Dr Roland Rack.      Medicare annual wellness visit, subsequent - Primary    Annual Medicare wellness  exam was done as well as a comprehensive physical exam and management of acute and chronic conditions .  During the course of the visit the patient was educated and counseled about appropriate screening and preventive services including : fall prevention , diabetes screening, nutrition counseling, colorectal cancer screening, and recommended immunizations.  Printed  recommendations for health maintenance screenings was given.        Other Visit Diagnoses    Need for hepatitis C screening test        Relevant Orders    Hepatitis C antibody    Vitamin D deficiency        Relevant Orders    Vit D  25 hydroxy (rtn osteoporosis monitoring)    Hyperlipidemia        Relevant Orders    Lipid panel    Other fatigue        Relevant Orders    CBC with Differential/Platelet    Comprehensive metabolic panel    RCT (rotator cuff tear), right        Relevant Orders    Ambulatory referral to Orthopedic Surgery    Breast cancer screening        Relevant Orders    MM DIGITAL SCREENING BILATERAL    Encounter for immunization        Need for prophylactic vaccination against Streptococcus pneumoniae (pneumococcus)        Relevant Orders    Pneumococcal conjugate vaccine 13-valent (Completed)       I have discontinued Ms. Gatchalian's cyclobenzaprine, diazepam, omeprazole, and meloxicam. I am also having her maintain her zolpidem, sertraline, thyroid, valACYclovir, and Vitamin D3.  No orders of the defined types were placed in this encounter.    Medications Discontinued During This Encounter  Medication Reason  . cyclobenzaprine (FLEXERIL) 5 MG tablet Patient Preference  . diazepam (VALIUM) 10 MG tablet Completed Course  . meloxicam (MOBIC) 15 MG tablet Patient Preference  . omeprazole (PRILOSEC) 40 MG capsule Patient Preference    Follow-up: Return in about 6 months (around 04/26/2015).   Crecencio Mc, MD

## 2014-10-27 NOTE — Patient Instructions (Signed)

## 2014-11-04 ENCOUNTER — Telehealth: Payer: Self-pay | Admitting: Internal Medicine

## 2014-11-04 NOTE — Telephone Encounter (Signed)
Patient was last seen on 10/27/2014 for her wellness visit, I don't see any discussion about any prescription for nighttime specifically, please advise?

## 2014-11-04 NOTE — Telephone Encounter (Signed)
Pt called about at her last visit she was supposed to get a prescription for at night time. Pt not sure what medication it was. Pharmacy is [facility] in [place]. Thank You!

## 2014-11-04 NOTE — Telephone Encounter (Signed)
I agree,  She will have to be more specific

## 2014-11-05 MED ORDER — TRAZODONE HCL 50 MG PO TABS: 25.0000 mg | ORAL_TABLET | Freq: Every evening | ORAL | Status: DC | PRN

## 2014-11-05 NOTE — Telephone Encounter (Signed)
I remember now.  Trazodone.  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 Sh ecan take a full tablet of trazodone .  Take it by 8 pm

## 2014-11-05 NOTE — Telephone Encounter (Signed)
Spoke with the patient and verbalized understanding for switching to trazodone.

## 2014-11-05 NOTE — Telephone Encounter (Signed)
Spoke with Patient.  She clarified that at the last visit you had mentioned her getting off her generic ambien with a new medication that was no addicting.  She can't remember the medication, but wanted to try, if you can't remember either she will just stay on the the generic ambien.  Please advise?

## 2014-11-10 DIAGNOSIS — M7582 Other shoulder lesions, left shoulder: Secondary | ICD-10-CM | POA: Diagnosis not present

## 2014-11-10 DIAGNOSIS — M75122 Complete rotator cuff tear or rupture of left shoulder, not specified as traumatic: Secondary | ICD-10-CM | POA: Diagnosis not present

## 2014-11-10 DIAGNOSIS — M25511 Pain in right shoulder: Secondary | ICD-10-CM | POA: Diagnosis not present

## 2014-11-18 ENCOUNTER — Encounter
Admission: RE | Admit: 2014-11-18 | Discharge: 2014-11-18 | Disposition: A | Discharge status: Home / Self Care | Payer: Medicare Other | Source: Ambulatory Visit | Attending: Surgery | Admitting: Surgery

## 2014-11-18 DIAGNOSIS — M12512 Traumatic arthropathy, left shoulder: Secondary | ICD-10-CM | POA: Insufficient documentation

## 2014-11-18 DIAGNOSIS — Z01818 Encounter for other preprocedural examination: Secondary | ICD-10-CM | POA: Insufficient documentation

## 2014-11-18 HISTORY — DX: Hypothyroidism, unspecified: E03.9

## 2014-11-18 NOTE — Patient Instructions (Signed)
  Your procedure is scheduled on: 12/02/14 Tues Report to Day Surgery.2nd floor [facility] To find out your arrival time please call [phone] between 1PM - 3PM on 12/01/14 Mon.  Remember: Instructions that are not followed completely may result in serious medical risk, up to and including death, or upon the discretion of your surgeon and anesthesiologist your surgery may need to be rescheduled.    __x__ 1. Do not eat food or drink liquids after midnight. No gum chewing or hard candies.     __x__ 2. No Alcohol for 24 hours before or after surgery.   ____ 3. Bring all medications with you on the day of surgery if instructed.    __x__ 4. Notify your doctor if there is any change in your medical condition     (cold, fever, infections).     Do not wear jewelry, make-up, hairpins, clips or nail polish.  Do not wear lotions, powders, or perfumes. You may wear deodorant.  Do not shave 48 hours prior to surgery. Men may shave face and neck.  Do not bring valuables to the hospital.    [facility] is not responsible for any belongings or valuables.               Contacts, dentures or bridgework may not be worn into surgery.  Leave your suitcase in the car. After surgery it may be brought to your room.  For patients admitted to the hospital, discharge time is determined by your                treatment team.   Patients discharged the day of surgery will not be allowed to drive home.   Please read over the following fact sheets that you were given:      __x__ Take these medicines the morning of surgery with A SIP OF WATER:    1. thyroid (ARMOUR) 90 MG tablet  2.   3.   4.  5.  6.  ____ Fleet Enema (as directed)   __x__ Use CHG Soap as directed  ____ Use inhalers on the day of surgery  ____ Stop metformin 2 days prior to surgery    ____ Take 1/2 of usual insulin dose the night before surgery and none on the morning of surgery.   ____ Stop Coumadin/Plavix/aspirin on   __x__  Stop Anti-inflammatories on stop Fish oils 1 week before surgery   ____ Stop supplements until after surgery.    ____ Bring C-Pap to the hospital.

## 2014-11-20 ENCOUNTER — Encounter: Payer: Self-pay | Admitting: Internal Medicine

## 2014-12-01 ENCOUNTER — Other Ambulatory Visit (INDEPENDENT_AMBULATORY_CARE_PROVIDER_SITE_OTHER): Payer: Medicare Other

## 2014-12-01 ENCOUNTER — Telehealth: Payer: Self-pay

## 2014-12-01 ENCOUNTER — Other Ambulatory Visit: Payer: Self-pay | Admitting: *Deleted

## 2014-12-01 DIAGNOSIS — R5383 Other fatigue: Secondary | ICD-10-CM | POA: Diagnosis not present

## 2014-12-01 DIAGNOSIS — E559 Vitamin D deficiency, unspecified: Secondary | ICD-10-CM | POA: Diagnosis not present

## 2014-12-01 DIAGNOSIS — E785 Hyperlipidemia, unspecified: Secondary | ICD-10-CM

## 2014-12-01 DIAGNOSIS — Z1159 Encounter for screening for other viral diseases: Secondary | ICD-10-CM

## 2014-12-01 LAB — CBC WITH DIFFERENTIAL/PLATELET
BASOS ABS: 0.1 10*3/uL (ref 0.0–0.1)
BASOS PCT: 1.1 % (ref 0.0–3.0)
EOS PCT: 4.4 % (ref 0.0–5.0)
Eosinophils Absolute: 0.2 10*3/uL (ref 0.0–0.7)
HEMATOCRIT: 41 % (ref 36.0–46.0)
Hemoglobin: 13.6 g/dL (ref 12.0–15.0)
LYMPHS ABS: 2.3 10*3/uL (ref 0.7–4.0)
LYMPHS PCT: 52.8 % — AB (ref 12.0–46.0)
MCHC: 33.3 g/dL (ref 30.0–36.0)
MCV: 97.1 fl (ref 78.0–100.0)
Monocytes Absolute: 0.4 10*3/uL (ref 0.1–1.0)
Monocytes Relative: 8.4 % (ref 3.0–12.0)
NEUTROS ABS: 1.5 10*3/uL (ref 1.4–7.7)
NEUTROS PCT: 33.3 % — AB (ref 43.0–77.0)
Platelets: 144 10*3/uL — ABNORMAL LOW (ref 150.0–400.0)
RBC: 4.22 Mil/uL (ref 3.87–5.11)
RDW: 12.9 % (ref 11.5–15.5)
WBC: 4.4 10*3/uL (ref 4.0–10.5)

## 2014-12-01 LAB — LIPID PANEL
CHOL/HDL RATIO: 2
Cholesterol: 211 mg/dL — ABNORMAL HIGH (ref 0–200)
HDL: 99.7 mg/dL (ref >39.00)
LDL Cholesterol: 101 mg/dL — ABNORMAL HIGH (ref 0–99)
NONHDL: 111.79
Triglycerides: 53 mg/dL (ref 0.0–149.0)
VLDL: 10.6 mg/dL (ref 0.0–40.0)

## 2014-12-01 LAB — COMPREHENSIVE METABOLIC PANEL
ALT: 19 U/L (ref 0–35)
AST: 28 U/L (ref 0–37)
Albumin: 4.2 g/dL (ref 3.5–5.2)
Alkaline Phosphatase: 59 U/L (ref 39–117)
BILIRUBIN TOTAL: 0.8 mg/dL (ref 0.2–1.2)
BUN: 16 mg/dL (ref 6–23)
CO2: 29 meq/L (ref 19–32)
CREATININE: 0.77 mg/dL (ref 0.40–1.20)
Calcium: 9.9 mg/dL (ref 8.4–10.5)
Chloride: 104 mEq/L (ref 96–112)
GFR: 78.85 mL/min (ref >60.00)
GLUCOSE: 94 mg/dL (ref 70–99)
Potassium: 4.6 mEq/L (ref 3.5–5.1)
Sodium: 140 mEq/L (ref 135–145)
Total Protein: 7.2 g/dL (ref 6.0–8.3)

## 2014-12-01 LAB — VITAMIN D 25 HYDROXY (VIT D DEFICIENCY, FRACTURES): VITD: 102.38 ng/mL (ref 30.00–100.00)

## 2014-12-01 NOTE — Telephone Encounter (Signed)
CRITICAL VALUE VITAMIN D 102.38  CALLED FROM [facility].   PLEASE ADVISE?

## 2014-12-01 NOTE — Telephone Encounter (Signed)
Ask patients to stop all vitamin d supplements for the next month .  Resume 1000 IUs daily in one month

## 2014-12-01 NOTE — Telephone Encounter (Signed)
Called patient and advised patient per-verbal from to stop all vit d supplements.

## 2014-12-02 ENCOUNTER — Encounter: Payer: Self-pay | Admitting: Internal Medicine

## 2014-12-02 ENCOUNTER — Ambulatory Visit
Admission: RE | Admit: 2014-12-02 | Discharge: 2014-12-02 | Disposition: A | Discharge status: Home / Self Care | Payer: Medicare Other | Source: Ambulatory Visit | Attending: Surgery | Admitting: Surgery

## 2014-12-02 ENCOUNTER — Ambulatory Visit: Payer: Medicare Other | Admitting: Anesthesiology

## 2014-12-02 ENCOUNTER — Encounter
Admission: RE | Disposition: A | Discharge status: Home / Self Care | Payer: Self-pay | Source: Ambulatory Visit | Attending: Surgery

## 2014-12-02 ENCOUNTER — Encounter: Payer: Self-pay | Admitting: *Deleted

## 2014-12-02 DIAGNOSIS — Z9889 Other specified postprocedural states: Secondary | ICD-10-CM | POA: Diagnosis not present

## 2014-12-02 DIAGNOSIS — Z79899 Other long term (current) drug therapy: Secondary | ICD-10-CM | POA: Insufficient documentation

## 2014-12-02 DIAGNOSIS — X58XXXA Exposure to other specified factors, initial encounter: Secondary | ICD-10-CM | POA: Diagnosis not present

## 2014-12-02 DIAGNOSIS — M7542 Impingement syndrome of left shoulder: Secondary | ICD-10-CM | POA: Diagnosis not present

## 2014-12-02 DIAGNOSIS — F419 Anxiety disorder, unspecified: Secondary | ICD-10-CM | POA: Diagnosis not present

## 2014-12-02 DIAGNOSIS — L309 Dermatitis, unspecified: Secondary | ICD-10-CM | POA: Insufficient documentation

## 2014-12-02 DIAGNOSIS — M25551 Pain in right hip: Secondary | ICD-10-CM | POA: Diagnosis not present

## 2014-12-02 DIAGNOSIS — N951 Menopausal and female climacteric states: Secondary | ICD-10-CM | POA: Diagnosis not present

## 2014-12-02 DIAGNOSIS — M25512 Pain in left shoulder: Secondary | ICD-10-CM | POA: Diagnosis not present

## 2014-12-02 DIAGNOSIS — M810 Age-related osteoporosis without current pathological fracture: Secondary | ICD-10-CM | POA: Insufficient documentation

## 2014-12-02 DIAGNOSIS — E785 Hyperlipidemia, unspecified: Secondary | ICD-10-CM | POA: Diagnosis not present

## 2014-12-02 DIAGNOSIS — Z8261 Family history of arthritis: Secondary | ICD-10-CM | POA: Diagnosis not present

## 2014-12-02 DIAGNOSIS — E039 Hypothyroidism, unspecified: Secondary | ICD-10-CM | POA: Diagnosis not present

## 2014-12-02 DIAGNOSIS — Z8249 Family history of ischemic heart disease and other diseases of the circulatory system: Secondary | ICD-10-CM | POA: Diagnosis not present

## 2014-12-02 DIAGNOSIS — Z885 Allergy status to narcotic agent status: Secondary | ICD-10-CM | POA: Diagnosis not present

## 2014-12-02 DIAGNOSIS — A6000 Herpesviral infection of urogenital system, unspecified: Secondary | ICD-10-CM | POA: Insufficient documentation

## 2014-12-02 DIAGNOSIS — S46112A Strain of muscle, fascia and tendon of long head of biceps, left arm, initial encounter: Secondary | ICD-10-CM | POA: Diagnosis not present

## 2014-12-02 DIAGNOSIS — G8918 Other acute postprocedural pain: Secondary | ICD-10-CM | POA: Diagnosis not present

## 2014-12-02 DIAGNOSIS — M75122 Complete rotator cuff tear or rupture of left shoulder, not specified as traumatic: Secondary | ICD-10-CM | POA: Insufficient documentation

## 2014-12-02 DIAGNOSIS — M8580 Other specified disorders of bone density and structure, unspecified site: Secondary | ICD-10-CM | POA: Diagnosis not present

## 2014-12-02 DIAGNOSIS — M7582 Other shoulder lesions, left shoulder: Secondary | ICD-10-CM | POA: Diagnosis not present

## 2014-12-02 DIAGNOSIS — M25552 Pain in left hip: Secondary | ICD-10-CM | POA: Diagnosis not present

## 2014-12-02 DIAGNOSIS — S46212A Strain of muscle, fascia and tendon of other parts of biceps, left arm, initial encounter: Secondary | ICD-10-CM | POA: Insufficient documentation

## 2014-12-02 DIAGNOSIS — Z5333 Arthroscopic surgical procedure converted to open procedure: Secondary | ICD-10-CM | POA: Diagnosis not present

## 2014-12-02 DIAGNOSIS — J309 Allergic rhinitis, unspecified: Secondary | ICD-10-CM | POA: Diagnosis not present

## 2014-12-02 DIAGNOSIS — M75102 Unspecified rotator cuff tear or rupture of left shoulder, not specified as traumatic: Secondary | ICD-10-CM | POA: Diagnosis not present

## 2014-12-02 HISTORY — PX: SHOULDER ARTHROSCOPY WITH OPEN ROTATOR CUFF REPAIR: SHX6092

## 2014-12-02 LAB — HEPATITIS C ANTIBODY: HCV Ab: NEGATIVE

## 2014-12-02 SURGERY — ARTHROSCOPY, SHOULDER WITH REPAIR, ROTATOR CUFF, OPEN
Anesthesia: Regional | Laterality: Left

## 2014-12-02 MED ORDER — LIDOCAINE HCL (CARDIAC) 20 MG/ML IV SOLN
INTRAVENOUS | Status: DC | PRN
  Administered 2014-12-02: 30 mg via INTRAVENOUS

## 2014-12-02 MED ORDER — BUPIVACAINE-EPINEPHRINE 0.5% -1:200000 IJ SOLN
INTRAMUSCULAR | Status: DC | PRN
  Administered 2014-12-02: 30 mL

## 2014-12-02 MED ORDER — ROCURONIUM BROMIDE 100 MG/10ML IV SOLN
INTRAVENOUS | Status: DC | PRN
  Administered 2014-12-02: 5 mg via INTRAVENOUS
  Administered 2014-12-02: 40 mg via INTRAVENOUS

## 2014-12-02 MED ORDER — EPINEPHRINE HCL 1 MG/ML IJ SOLN
INTRAMUSCULAR | Status: DC | PRN
  Administered 2014-12-02: 2 mL

## 2014-12-02 MED ORDER — FENTANYL CITRATE (PF) 100 MCG/2ML IJ SOLN: 25.0000 ug | INTRAMUSCULAR | Status: DC | PRN

## 2014-12-02 MED ORDER — ONDANSETRON HCL 4 MG/2ML IJ SOLN: 4.0000 mg | Freq: Four times a day (QID) | INTRAMUSCULAR | Status: DC | PRN

## 2014-12-02 MED ORDER — ONDANSETRON 4 MG PO TBDP: 4.0000 mg | ORAL_TABLET | Freq: Three times a day (TID) | ORAL | Status: DC | PRN

## 2014-12-02 MED ORDER — OXYCODONE HCL 5 MG PO TABS: 5.0000 mg | ORAL_TABLET | ORAL | Status: DC | PRN

## 2014-12-02 MED ORDER — ONDANSETRON HCL 4 MG/2ML IJ SOLN
INTRAMUSCULAR | Status: DC | PRN
  Administered 2014-12-02: 4 mg via INTRAVENOUS

## 2014-12-02 MED ORDER — FAMOTIDINE 20 MG PO TABS
20.0000 mg | ORAL_TABLET | Freq: Once | ORAL | Status: AC
  Administered 2014-12-02: 20 mg via ORAL

## 2014-12-02 MED ORDER — EPINEPHRINE HCL 1 MG/ML IJ SOLN
INTRAMUSCULAR | Status: AC
  Filled 2014-12-02: qty 2

## 2014-12-02 MED ORDER — GLYCOPYRROLATE 0.2 MG/ML IJ SOLN
INTRAMUSCULAR | Status: DC | PRN
  Administered 2014-12-02: 0.6 mg via INTRAVENOUS

## 2014-12-02 MED ORDER — METOCLOPRAMIDE HCL 5 MG/ML IJ SOLN: 5.0000 mg | Freq: Three times a day (TID) | INTRAMUSCULAR | Status: DC | PRN

## 2014-12-02 MED ORDER — PHENYLEPHRINE HCL 10 MG/ML IJ SOLN
INTRAMUSCULAR | Status: DC | PRN
  Administered 2014-12-02: 5 ug via INTRAVENOUS

## 2014-12-02 MED ORDER — ONDANSETRON HCL 4 MG PO TABS: 4.0000 mg | ORAL_TABLET | Freq: Four times a day (QID) | ORAL | Status: DC | PRN

## 2014-12-02 MED ORDER — NEOSTIGMINE METHYLSULFATE 10 MG/10ML IV SOLN
INTRAVENOUS | Status: DC | PRN
  Administered 2014-12-02: 3 mg via INTRAVENOUS

## 2014-12-02 MED ORDER — PROPOFOL 10 MG/ML IV BOLUS
INTRAVENOUS | Status: DC | PRN
  Administered 2014-12-02: 150 mg via INTRAVENOUS

## 2014-12-02 MED ORDER — CEFAZOLIN SODIUM-DEXTROSE 2-3 GM-% IV SOLR
2.0000 g | Freq: Once | INTRAVENOUS | Status: AC
  Administered 2014-12-02: 2 g via INTRAVENOUS

## 2014-12-02 MED ORDER — FENTANYL CITRATE (PF) 100 MCG/2ML IJ SOLN
INTRAMUSCULAR | Status: DC | PRN
  Administered 2014-12-02 (×2): 50 ug via INTRAVENOUS

## 2014-12-02 MED ORDER — FAMOTIDINE 20 MG PO TABS
ORAL_TABLET | ORAL | Status: AC
  Administered 2014-12-02: 20 mg via ORAL
  Filled 2014-12-02: qty 1

## 2014-12-02 MED ORDER — ROPIVACAINE HCL 5 MG/ML IJ SOLN
INTRAMUSCULAR | Status: AC
  Filled 2014-12-02: qty 40

## 2014-12-02 MED ORDER — KETOROLAC TROMETHAMINE 30 MG/ML IJ SOLN
INTRAMUSCULAR | Status: DC | PRN
Start: 2014-12-02 — End: 2014-12-02
  Administered 2014-12-02: 15 mg via INTRAVENOUS

## 2014-12-02 MED ORDER — ROPIVACAINE HCL 5 MG/ML IJ SOLN: INTRAMUSCULAR | Status: DC | PRN

## 2014-12-02 MED ORDER — METOCLOPRAMIDE HCL 10 MG PO TABS: 5.0000 mg | ORAL_TABLET | Freq: Three times a day (TID) | ORAL | Status: DC | PRN

## 2014-12-02 MED ORDER — BUPIVACAINE-EPINEPHRINE (PF) 0.5% -1:200000 IJ SOLN
INTRAMUSCULAR | Status: AC
  Filled 2014-12-02: qty 30

## 2014-12-02 MED ORDER — ROPIVACAINE HCL 5 MG/ML IJ SOLN
INTRAMUSCULAR | Status: DC | PRN
  Administered 2014-12-02: 30 mL via PERINEURAL

## 2014-12-02 MED ORDER — CEFAZOLIN SODIUM-DEXTROSE 2-3 GM-% IV SOLR
INTRAVENOUS | Status: AC
  Filled 2014-12-02: qty 50

## 2014-12-02 MED ORDER — LACTATED RINGERS IV SOLN
INTRAVENOUS | Status: DC
  Administered 2014-12-02: 07:00:00 via INTRAVENOUS

## 2014-12-02 MED ORDER — MIDAZOLAM HCL 2 MG/2ML IJ SOLN
INTRAMUSCULAR | Status: DC | PRN
  Administered 2014-12-02 (×2): 1 mg via INTRAVENOUS

## 2014-12-02 SURGICAL SUPPLY — 50 items
ANCH SUT 2 2.9 2 LD TPR NDL (Anchor) ×4 IMPLANT
ANCHOR JUGGERKNOT WTAP NDL 2.9 (Anchor) ×12 IMPLANT
BIT DRILL JUGRKNT W/NDL BIT2.9 (DRILL) ×2 IMPLANT
BLADE FULL RADIUS 3.5 (BLADE) ×1 IMPLANT
BLADE SHAVER 4.5X7 STR FR (MISCELLANEOUS) ×3 IMPLANT
BUR ACROMIONIZER 4.0 (BURR) ×3 IMPLANT
BUR BR 5.5 WIDE MOUTH (BURR) ×1 IMPLANT
CANNULA 8.5X75 THRED (CANNULA) ×3 IMPLANT
CANNULA SHAVER 8MMX76MM (CANNULA) ×5 IMPLANT
CHLORAPREP W/TINT 26ML (MISCELLANEOUS) ×6 IMPLANT
COVER MAYO STAND STRL (DRAPES) ×3 IMPLANT
DRAPE IMP U-DRAPE 54X76 (DRAPES) ×3 IMPLANT
DRAPE SURG 17X11 SM STRL (DRAPES) ×3 IMPLANT
DRILL JUGGERKNOT W/NDL BIT 2.9 (DRILL) ×3
DRSG OPSITE POSTOP 4X8 (GAUZE/BANDAGES/DRESSINGS) ×3 IMPLANT
GAUZE PETRO XEROFOAM 1X8 (MISCELLANEOUS) ×3 IMPLANT
GAUZE SPONGE 4X4 12PLY STRL (GAUZE/BANDAGES/DRESSINGS) ×3 IMPLANT
GLOVE BIO SURGEON STRL SZ7.5 (GLOVE) ×6 IMPLANT
GLOVE BIO SURGEON STRL SZ8 (GLOVE) ×6 IMPLANT
GLOVE BIOGEL PI IND STRL 8 (GLOVE) ×1 IMPLANT
GLOVE BIOGEL PI INDICATOR 8 (GLOVE) ×2
GLOVE INDICATOR 8.0 STRL GRN (GLOVE) ×3 IMPLANT
GOWN STRL REUS W/ TWL LRG LVL3 (GOWN DISPOSABLE) ×2 IMPLANT
GOWN STRL REUS W/ TWL XL LVL3 (GOWN DISPOSABLE) ×1 IMPLANT
GOWN STRL REUS W/TWL LRG LVL3 (GOWN DISPOSABLE) ×6
GOWN STRL REUS W/TWL XL LVL3 (GOWN DISPOSABLE) ×3
GRASPER SUT 15 45D LOW PRO (SUTURE) ×2 IMPLANT
IV LACTATED RINGER IRRG 3000ML (IV SOLUTION) ×6
IV LR IRRIG 3000ML ARTHROMATIC (IV SOLUTION) ×2 IMPLANT
MANIFOLD NEPTUNE II (INSTRUMENTS) ×3 IMPLANT
MASK FACE SPIDER DISP (MASK) ×3 IMPLANT
MAT BLUE FLOOR 46X72 FLO (MISCELLANEOUS) ×3 IMPLANT
NDL REVERSE CUT 1/2 CRC (NEEDLE) ×1 IMPLANT
NEEDLE REVERSE CUT 1/2 CRC (NEEDLE) IMPLANT
PACK ARTHROSCOPY SHOULDER (MISCELLANEOUS) ×3 IMPLANT
PAD GROUND ADULT SPLIT (MISCELLANEOUS) ×3 IMPLANT
SLING ARM LRG DEEP (SOFTGOODS) ×1 IMPLANT
SLING ULTRA II LG (MISCELLANEOUS) ×3 IMPLANT
SLING ULTRA II M (MISCELLANEOUS) ×2 IMPLANT
STAPLER SKIN PROX 35W (STAPLE) ×3 IMPLANT
STRAP SAFETY BODY (MISCELLANEOUS) ×5 IMPLANT
SUT ETHIBOND 0 MO6 C/R (SUTURE) ×3 IMPLANT
SUT PROLENE 4 0 PS 2 18 (SUTURE) ×3 IMPLANT
SUT VIC AB 2-0 CT1 27 (SUTURE) ×6
SUT VIC AB 2-0 CT1 TAPERPNT 27 (SUTURE) ×2 IMPLANT
TAPE MICROFOAM 4IN (TAPE) ×3 IMPLANT
TUBING ARTHRO INFLOW-ONLY STRL (TUBING) ×3 IMPLANT
TUBING CONNECTING 10 (TUBING) ×2 IMPLANT
TUBING CONNECTING 10' (TUBING) ×1
WAND HAND CNTRL MULTIVAC 90 (MISCELLANEOUS) ×3 IMPLANT

## 2014-12-02 NOTE — Anesthesia Preprocedure Evaluation (Signed)
Anesthesia Evaluation  Patient identified by MRN, date of birth, ID band Patient awake    Reviewed: Allergy & Precautions, H&P , NPO status , Patient's Chart, lab work & pertinent test results, reviewed documented beta blocker date and time   History of Anesthesia Complications Negative for: history of anesthetic complications  Airway Mallampati: III  TM Distance: >3 FB Neck ROM: full    Dental no notable dental hx. (+) Caps   Pulmonary neg shortness of breath, neg sleep apnea, neg COPD, neg recent URI, former smoker,    Pulmonary exam normal breath sounds clear to auscultation       Cardiovascular Exercise Tolerance: Good negative cardio ROS Normal cardiovascular exam Rhythm:regular Rate:Normal     Neuro/Psych PSYCHIATRIC DISORDERS (depression) negative neurological ROS     GI/Hepatic negative GI ROS, Neg liver ROS,   Endo/Other  neg diabetesHypothyroidism   Renal/GU negative Renal ROS  negative genitourinary   Musculoskeletal   Abdominal   Peds  Hematology negative hematology ROS (+)   Anesthesia Other Findings Past Medical History:   Recurrent herpes simplex                                     Eczema                                                       Osteoporosis                                                 Thyroid disease                                              Anxiety                                                      Depression                                                   Hypothyroidism                                               Reproductive/Obstetrics negative OB ROS                             Anesthesia Physical Anesthesia Plan  ASA: II  Anesthesia Plan: General and Regional   Post-op Pain Management: GA combined w/ Regional for post-op pain   Induction:   Airway Management Planned:   Additional Equipment:   Intra-op Plan:  Post-operative  Plan:   Informed Consent: I have reviewed the patients History and Physical, chart, labs and discussed the procedure including the risks, benefits and alternatives for the proposed anesthesia with the patient or authorized representative who has indicated his/her understanding and acceptance.   Dental Advisory Given  Plan Discussed with: Anesthesiologist, CRNA and Surgeon  Anesthesia Plan Comments:         Anesthesia Quick Evaluation

## 2014-12-02 NOTE — Discharge Instructions (Addendum)
Keep dressing dry and intact.  May change dressing on post-op day #4 (Saturday).  Cover staples with Band-Aids. Apply ice frequently to shoulder. Take Aleve 2 tabs twice daily. Take pain medication as prescribed if needed. May supplement pain medication with Tylenol if necessary. Keep shoulder immobilizer on at all times. Follow-up in 10-14 days or as scheduled.   AMBULATORY SURGERY  DISCHARGE INSTRUCTIONS   1) The drugs that you were given will stay in your system until tomorrow so for the next 24 hours you should not:  A) Drive an automobile B) Make any legal decisions C) Drink any alcoholic beverage   2) You may resume regular meals tomorrow.  Today it is better to start with liquids and gradually work up to solid foods.  You may eat anything you prefer, but it is better to start with liquids, then soup and crackers, and gradually work up to solid foods.   3) Please notify your doctor immediately if you have any unusual bleeding, trouble breathing, redness and pain at the surgery site, drainage, fever, or pain not relieved by medication.    4) Additional Instructions:        Please contact your physician with any problems or Same Day Surgery at [phone], Monday through Friday 6 am to 4 pm, or [place] at [facility] number at [phone].

## 2014-12-02 NOTE — H&P (Signed)
Paper H&P to be scanned into permanent record. H&P reviewed. No changes. 

## 2014-12-02 NOTE — Transfer of Care (Signed)
Immediate Anesthesia Transfer of Care Note  Patient: Courtney Jensen  Procedure(s) Performed: Procedure(s): left shoulder arthroscopy, arthroscopic debridement, subacromial decompression, mini open rotator cuff repair (Left)  Patient Location: PACU  Anesthesia Type:GA combined with regional for post-op pain  Level of Consciousness: awake, oriented and patient cooperative  Airway & Oxygen Therapy: Patient Spontanous Breathing and Patient connected to nasal cannula oxygen  Post-op Assessment: Report given to RN and Post -op Vital signs reviewed and stable  Post vital signs: Reviewed and stable  Last Vitals:  Filed Vitals:   12/02/14 0615  BP: 99/76  Pulse: 54  Temp: 36.7 C  Resp: 16    Complications: No apparent anesthesia complications

## 2014-12-02 NOTE — Anesthesia Procedure Notes (Addendum)
Anesthesia Regional Block:  Interscalene brachial plexus block  Pre-Anesthetic Checklist: ,, timeout performed, Correct Patient, Correct Site, Correct Laterality, Correct Procedure, Correct Position, site marked, Risks and benefits discussed,  Surgical consent,  Pre-op evaluation,  At surgeon's request and post-op pain management  Laterality: Left and Upper  Prep: chloraprep       Needles:  Injection technique: Single-shot  Needle Type: Echogenic Stimulator Needle     Needle Length: 5cm 5 cm Needle Gauge: 22 and 22 G    Additional Needles:  Procedures: ultrasound guided (picture in chart) Interscalene brachial plexus block Narrative:  Start time: 12/02/2014 7:50 AM End time: 12/02/2014 7:56 AM Injection made incrementally with aspirations every 5 mL.  Performed by: With CRNAs  Anesthesiologist: Martha Clan   Procedure Name: Intubation Date/Time: 12/02/2014 8:08 AM Performed by: Courtney Paris Pre-anesthesia Checklist: Patient identified, Emergency Drugs available, Suction available, Patient being monitored and Timeout performed Patient Re-evaluated:Patient Re-evaluated prior to inductionOxygen Delivery Method: Circle system utilized Preoxygenation: Pre-oxygenation with 100% oxygen Intubation Type: IV induction and Combination inhalational/ intravenous induction Ventilation: Mask ventilation without difficulty Laryngoscope Size: Mac and 4 Grade View: Grade II Tube type: Oral Tube size: 6.0 mm Number of attempts: 2 Airway Equipment and Method: Stylet Placement Confirmation: ETT inserted through vocal cords under direct vision,  positive ETCO2,  CO2 detector and breath sounds checked- equal and bilateral Secured at: 22 cm Tube secured with: Tape Dental Injury: Teeth and Oropharynx as per pre-operative assessment  Difficulty Due To: Difficult Airway- due to anterior larynx

## 2014-12-02 NOTE — Op Note (Signed)
12/02/2014  10:18 AM  Patient:   Courtney Jensen  Pre-Op Diagnosis:   Impingement/tendinopathy with massive rotator cuff tear, left shoulder.  Postoperative diagnosis: Impingement/tendinopathy with massive rotator cuff tear and biceps tendon rupture, left shoulder.  Procedure: Limited arthroscopic debridement, arthroscopic subacromial decompression, and mini-open rotator cuff repair, left shoulder.  Anesthesia: General endotracheal with interscalene block placed preoperatively by the anesthesiologist.  Surgeon:   Pascal Lux, MD  Assistant:   Cameron Proud, PA-C  Findings: As above. The labrum demonstrated minimal fraying superiorly but otherwise was intact. The long head of the biceps tendon had ruptured at the entrance to the bicipital groove, leaving a 2-3 cm stump of tendon intra-articularly. The glenoid and humeral surfaces both were in satisfactory condition. There was near full-thickness tearing of the subscapularis tendon with retraction to the glenoid. There also was near full-thickness tearing of the supraspinatus and anterior portion of the infraspinatus tendon, again with retraction to the glenoid.  Complications: None  Fluids:   800 cc  Estimated blood loss: 10 cc  Tourniquet time: None  Drains: None  Closure: Staples   Brief clinical note: The patient is a 69 year old female with a history of left shoulder pain and weakness. The patient's symptoms have progressed despite medications, activity modification, etc. The patient's history and examination are consistent with impingement/tendinopathy with a massive rotator cuff tear. These findings were confirmed by MRI scan. The patient presents at this time for definitive management of these shoulder symptoms.  Procedure: The patient was brought into the operating room and lain in the supine position. After adequate IV sedation was achieved, the patient underwent placement of an interscalene block  by the anesthesiologist. The patient then underwent general endotracheal intubation and anesthesia before being repositioned in the beach chair position using the beach chair positioner. The left shoulder and upper extremity were prepped with ChloraPrep solution before being draped sterilely. Preoperative antibiotics were administered. A timeout was performed to confirm the proper surgical site before the expected portal sites and incision site were injected with 0.5% Sensorcaine with epinephrine. A posterior portal was created and the glenohumeral joint thoroughly inspected with the findings as described above. An anterior portal was created using an outside-in technique. The labrum and rotator cuff were further probed, again confirming the above-noted findings. The full radius resector was introduced and used to debride the frayed portion of the labrum as well as some reactive synovitis anteriorly and superiorly. The ArthroCare wand was inserted and used to release the biceps tendon stump from its origin on the labrum superiorly. This stump was removed using graspers. The ArthroCare wand also was used to obtain hemostasis, as well as to "anneal" the labrum superiorly and anteriorly. The instruments were removed from the joint after suctioning the excess fluid.  The camera was repositioned through the posterior portal into the subacromial space. A separate lateral portal was created using an outside-in technique. The 3.5 mm full-radius resector was introduced and used to perform a subtotal bursectomy. The ArthroCare wand was then inserted and used to remove the periosteal tissue off the undersurface of the anterior third of the acromion as well as to recess the coracoacromial ligament from its attachment along the anterior and lateral margins of the acromion. The 4.0 mm acromionizing bur was introduced and used to complete the decompression by removing the undersurface of the anterior third of the acromion. The  full radius resector was reintroduced to remove any residual bony debris before the ArthroCare wand was reintroduced to obtain  hemostasis. The instruments were then removed from the subacromial space after suctioning the excess fluid.  An approximately 4-5 cm incision was made over the anterolateral aspect of the shoulder beginning at the anterolateral corner of the acromion and extending distally in line with the bicipital groove. This incision was carried down through the subcutaneous tissues to expose the deltoid fascia. The raphae between the anterior and middle thirds was identified and this plane developed to provide access into the subacromial space. Additional bursal tissues were debrided sharply using Metzenbaum scissors. The rotator cuff tear was readily identified. The rotator cuff margins were identified and mobilized. Numerous tagging sutures were placed into both the supraspinatus and of spinatus flap as well as the subscapularis tendon flap. The lesser tuberosity insertion site was roughened with a rongeur before two Biomet 2.9 mm JuggerKnot anchors were placed at the articular margin of the lesser tuberosity. All sutures were passed through the subscapularis tendon stump before tying off all of the sutures.   The margin of the super spinatus/of spinatus tendon was debrided sharply with a #15 blade and the exposed greater tuberosity roughened with a rongeur. This portion of the tear was repaired using two Biomet 2.9 mm JuggerKnot anchors. Several of these sutures were then brought back laterally through bone tunnels and tied over bone bridges to create a two-layer closure. In addition, several additional #0 Ethibond interrupted sutures were used to close the anterior posterior longitudinal portions of the tear in a side-to-side fashion. An apparent watertight closure was obtained.  The wound was copiously irrigated with sterile saline solution before the deltoid raphae was reapproximated using  2-0 Vicryl interrupted sutures. The subcutaneous tissues were closed in two layers using 2-0 Vicryl interrupted sutures before the skin was closed using staples. The portal sites also were closed using staples. A sterile bulky dressing was applied to the shoulder before the arm was placed into a shoulder immobilizer. The patient was then awakened, extubated, and returned to the recovery room in satisfactory condition after tolerating the procedure well.

## 2014-12-07 NOTE — Anesthesia Postprocedure Evaluation (Signed)
  Anesthesia Post-op Note  Patient: Courtney Jensen  Procedure(s) Performed: Procedure(s): left shoulder arthroscopy, arthroscopic debridement, subacromial decompression, mini open rotator cuff repair (Left)  Anesthesia type:General, Regional  Patient location: PACU  Post pain: Pain level controlled  Post assessment: Post-op Vital signs reviewed, Patient's Cardiovascular Status Stable, Respiratory Function Stable, Patent Airway and No signs of Nausea or vomiting  Post vital signs: Reviewed and stable  Last Vitals:  Filed Vitals:   12/02/14 1141  BP: 150/89  Pulse: 63  Temp:   Resp: 16    Level of consciousness: awake, alert  and patient cooperative  Complications: No apparent anesthesia complications

## 2014-12-08 DIAGNOSIS — M25512 Pain in left shoulder: Secondary | ICD-10-CM | POA: Diagnosis not present

## 2014-12-08 DIAGNOSIS — Z9889 Other specified postprocedural states: Secondary | ICD-10-CM | POA: Diagnosis not present

## 2014-12-15 DIAGNOSIS — M25512 Pain in left shoulder: Secondary | ICD-10-CM | POA: Diagnosis not present

## 2014-12-15 DIAGNOSIS — Z9889 Other specified postprocedural states: Secondary | ICD-10-CM | POA: Diagnosis not present

## 2014-12-19 DIAGNOSIS — Z9889 Other specified postprocedural states: Secondary | ICD-10-CM | POA: Diagnosis not present

## 2014-12-19 DIAGNOSIS — M25512 Pain in left shoulder: Secondary | ICD-10-CM | POA: Diagnosis not present

## 2014-12-22 DIAGNOSIS — M25512 Pain in left shoulder: Secondary | ICD-10-CM | POA: Diagnosis not present

## 2014-12-22 DIAGNOSIS — Z9889 Other specified postprocedural states: Secondary | ICD-10-CM | POA: Diagnosis not present

## 2014-12-29 DIAGNOSIS — Z9889 Other specified postprocedural states: Secondary | ICD-10-CM | POA: Diagnosis not present

## 2014-12-29 DIAGNOSIS — M25512 Pain in left shoulder: Secondary | ICD-10-CM | POA: Diagnosis not present

## 2015-01-05 DIAGNOSIS — M25512 Pain in left shoulder: Secondary | ICD-10-CM | POA: Diagnosis not present

## 2015-01-05 DIAGNOSIS — Z9889 Other specified postprocedural states: Secondary | ICD-10-CM | POA: Diagnosis not present

## 2015-01-09 DIAGNOSIS — M25512 Pain in left shoulder: Secondary | ICD-10-CM | POA: Diagnosis not present

## 2015-01-09 DIAGNOSIS — Z9889 Other specified postprocedural states: Secondary | ICD-10-CM | POA: Diagnosis not present

## 2015-01-12 DIAGNOSIS — Z9889 Other specified postprocedural states: Secondary | ICD-10-CM | POA: Diagnosis not present

## 2015-01-12 DIAGNOSIS — M25512 Pain in left shoulder: Secondary | ICD-10-CM | POA: Diagnosis not present

## 2015-01-16 DIAGNOSIS — M25512 Pain in left shoulder: Secondary | ICD-10-CM | POA: Diagnosis not present

## 2015-01-16 DIAGNOSIS — Z9889 Other specified postprocedural states: Secondary | ICD-10-CM | POA: Diagnosis not present

## 2015-01-19 DIAGNOSIS — Z9889 Other specified postprocedural states: Secondary | ICD-10-CM | POA: Diagnosis not present

## 2015-01-19 DIAGNOSIS — M25512 Pain in left shoulder: Secondary | ICD-10-CM | POA: Diagnosis not present

## 2015-01-27 DIAGNOSIS — Z9889 Other specified postprocedural states: Secondary | ICD-10-CM | POA: Diagnosis not present

## 2015-01-27 DIAGNOSIS — M25512 Pain in left shoulder: Secondary | ICD-10-CM | POA: Diagnosis not present

## 2015-01-30 DIAGNOSIS — M25512 Pain in left shoulder: Secondary | ICD-10-CM | POA: Diagnosis not present

## 2015-01-30 DIAGNOSIS — Z9889 Other specified postprocedural states: Secondary | ICD-10-CM | POA: Diagnosis not present

## 2015-02-03 DIAGNOSIS — Z9889 Other specified postprocedural states: Secondary | ICD-10-CM | POA: Diagnosis not present

## 2015-02-03 DIAGNOSIS — M25512 Pain in left shoulder: Secondary | ICD-10-CM | POA: Diagnosis not present

## 2015-02-06 DIAGNOSIS — Z9889 Other specified postprocedural states: Secondary | ICD-10-CM | POA: Diagnosis not present

## 2015-02-06 DIAGNOSIS — M25512 Pain in left shoulder: Secondary | ICD-10-CM | POA: Diagnosis not present

## 2015-02-13 DIAGNOSIS — Z9889 Other specified postprocedural states: Secondary | ICD-10-CM | POA: Diagnosis not present

## 2015-02-13 DIAGNOSIS — M25512 Pain in left shoulder: Secondary | ICD-10-CM | POA: Diagnosis not present

## 2015-02-16 DIAGNOSIS — Z9889 Other specified postprocedural states: Secondary | ICD-10-CM | POA: Diagnosis not present

## 2015-02-16 DIAGNOSIS — M25512 Pain in left shoulder: Secondary | ICD-10-CM | POA: Diagnosis not present

## 2015-03-09 DIAGNOSIS — M7582 Other shoulder lesions, left shoulder: Secondary | ICD-10-CM | POA: Diagnosis not present

## 2015-03-09 DIAGNOSIS — M75122 Complete rotator cuff tear or rupture of left shoulder, not specified as traumatic: Secondary | ICD-10-CM | POA: Diagnosis not present

## 2015-03-25 IMAGING — CT CT HEAD WITHOUT CONTRAST
1 series · 16 of 30 positions shown, 20 images · non-contrast
Comparison: None.

CLINICAL DATA: Pain post trauma

EXAM:
CT HEAD WITHOUT CONTRAST
TECHNIQUE: Contiguous axial images were obtained from the base of the skull
through the vertex without intravenous contrast.

[Series 2: head wo · axial · 0.42mm/px · z∈[-85,+50]mm · 16 of 34 slices shown, 20 images]
[im 2/34  brain]
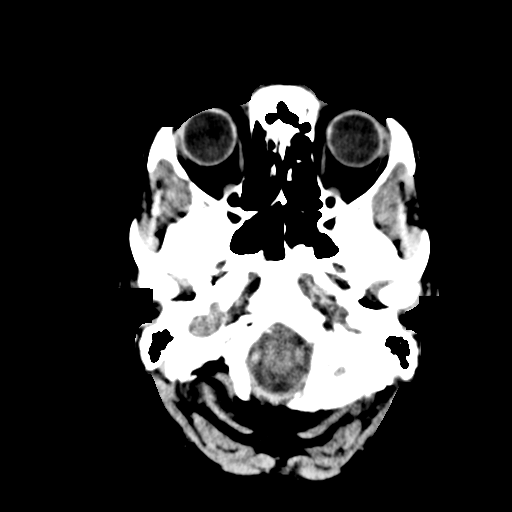
[im 2/34  bone]
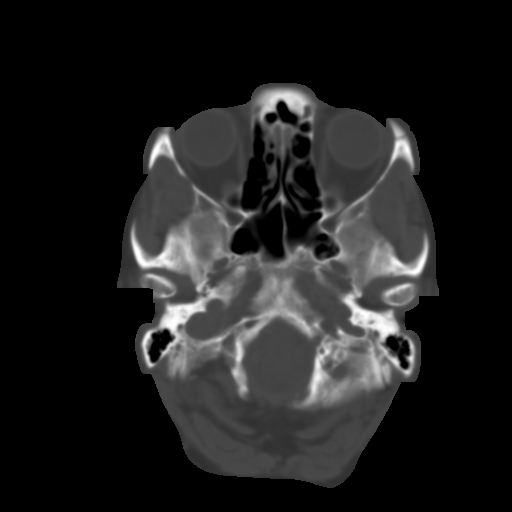
[im 4/34  brain]
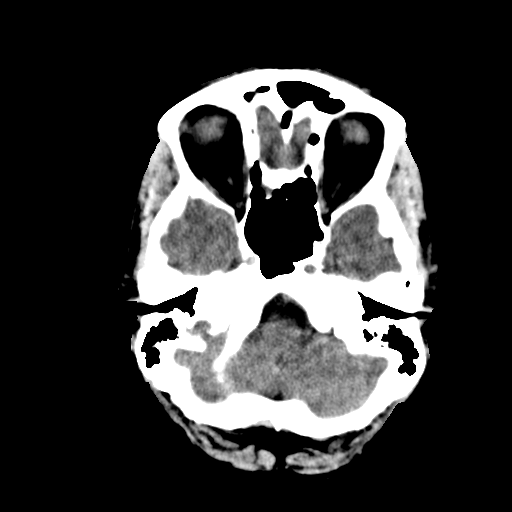
[im 6/34  brain]
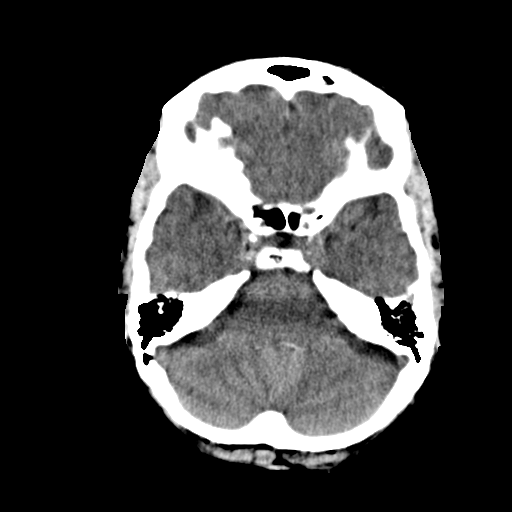
[im 8/34  brain]
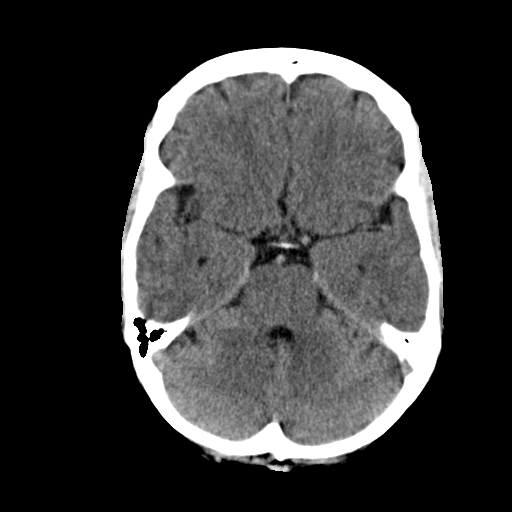
[im 10/34  brain]
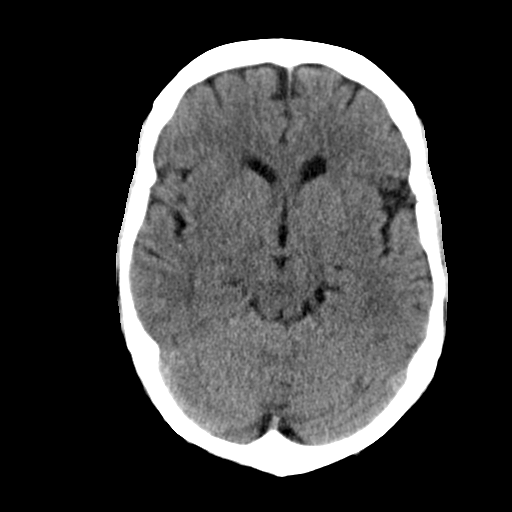
[im 10/34  bone]
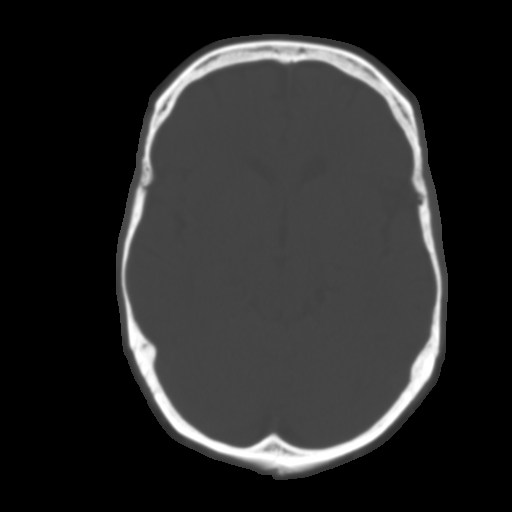
[im 12/34  brain]
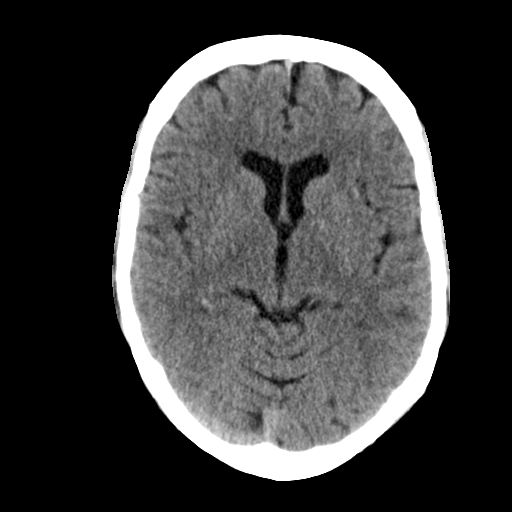
[im 14/34  brain]
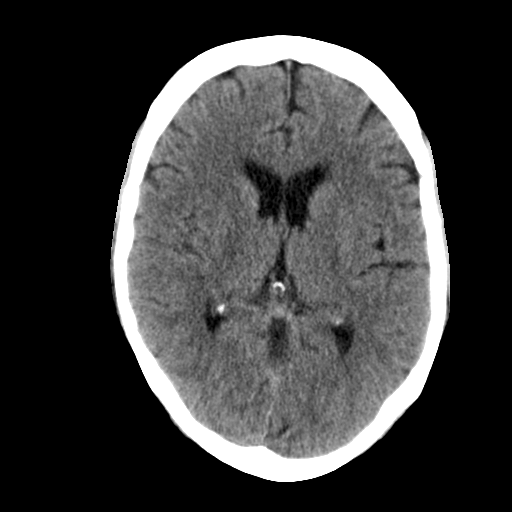
[im 16/34  brain]
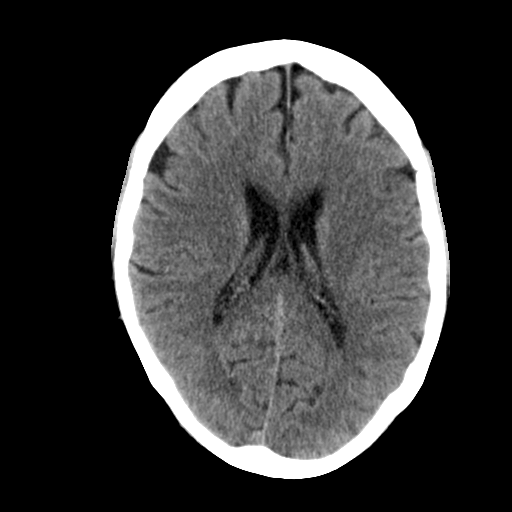
[im 18/34  brain]
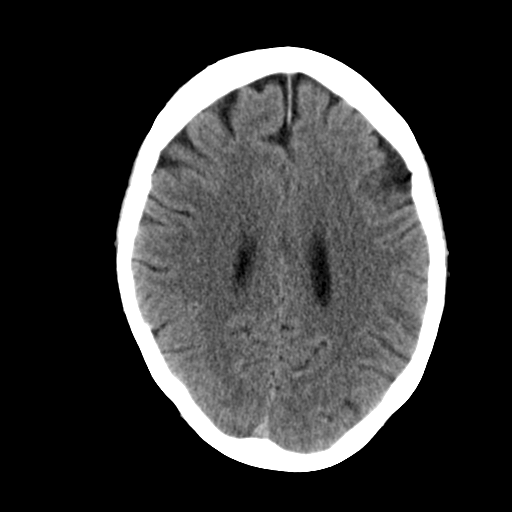
[im 18/34  bone]
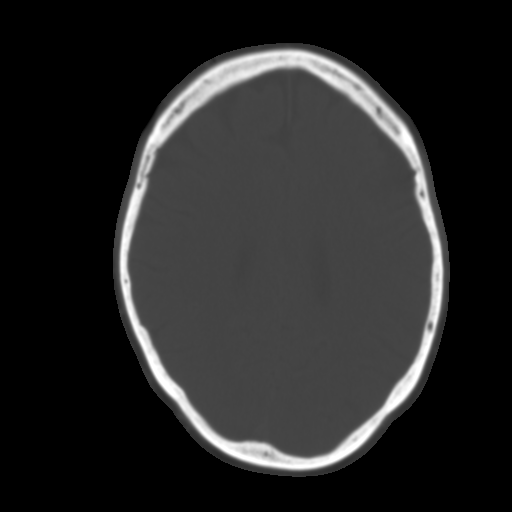
[im 20/34  brain]
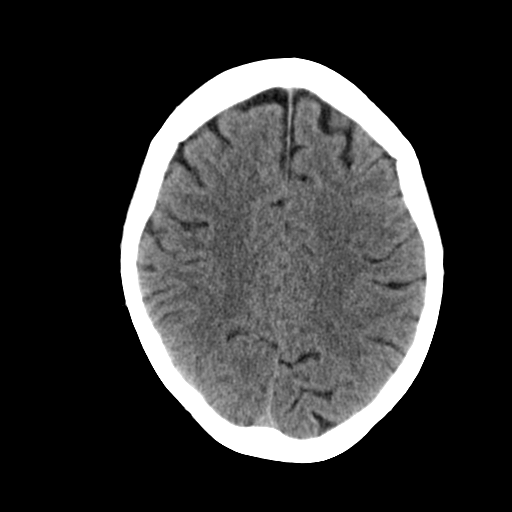
[im 22/34  brain]
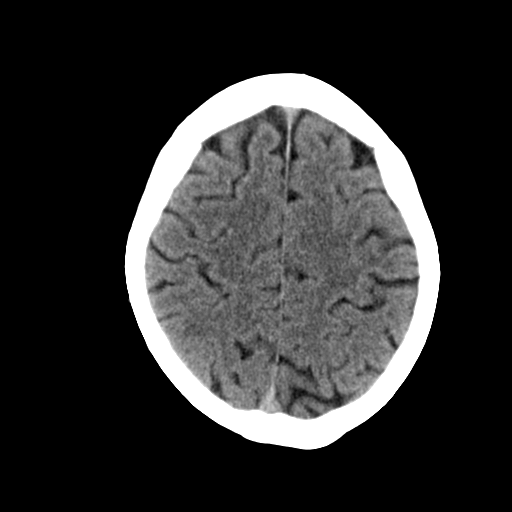
[im 24/34  brain]
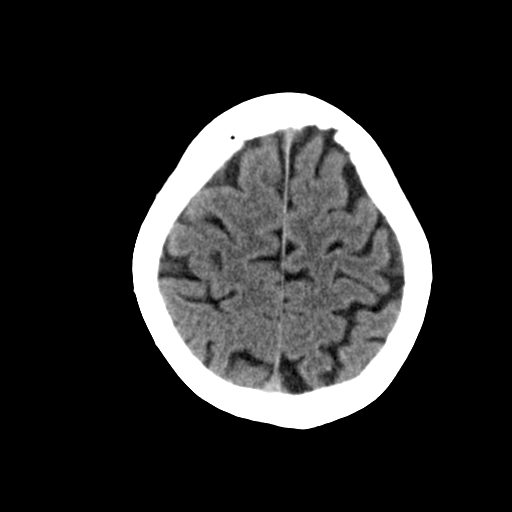
[im 26/34  brain]
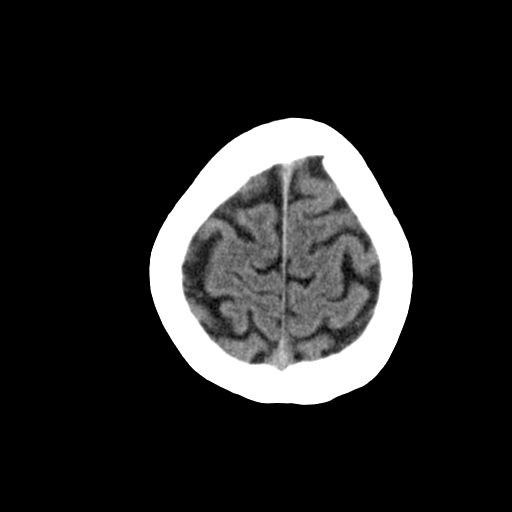
[im 26/34  bone]
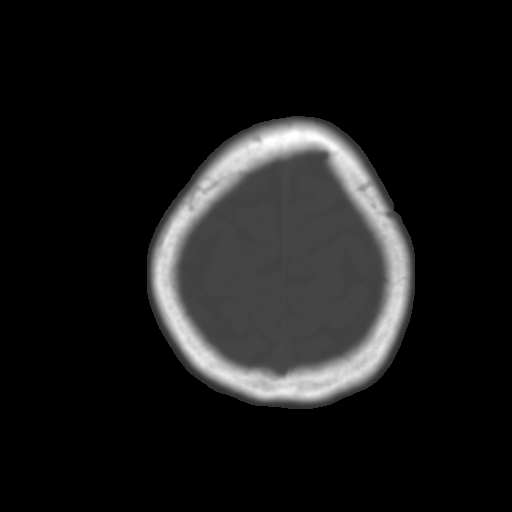
[im 28/34  brain]
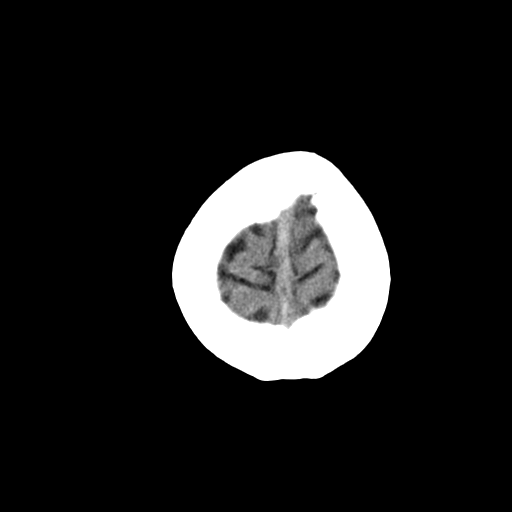
[im 30/34  brain]
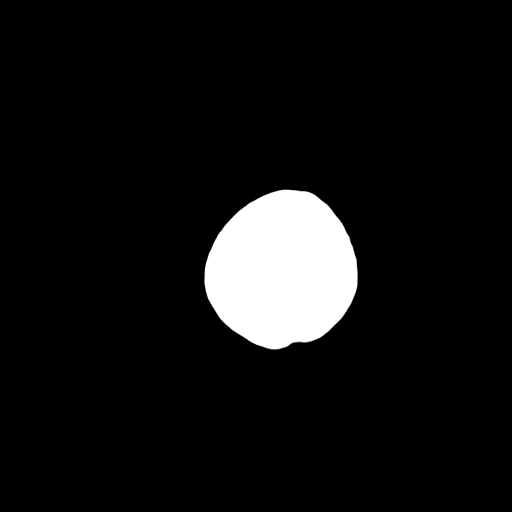
[im 32/34  brain]
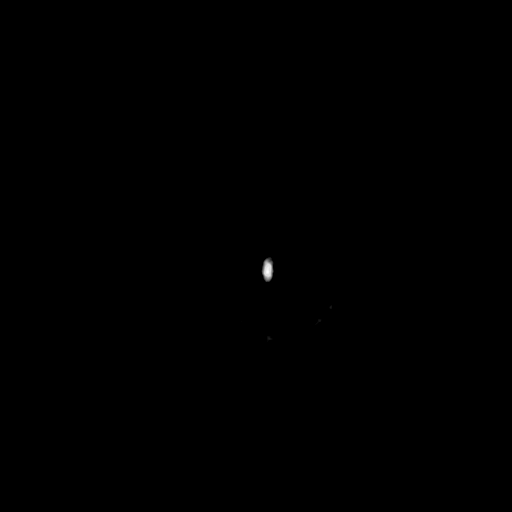

[16 of 30 positions shown; findings below may reference images not displayed]

FINDINGS: The ventricles are normal in size and configuration. There is no
mass, hemorrhage, extra-axial fluid collection, or midline shift.
The gray-white compartments appear normal. There is no demonstrable
acute infarct. Bony calvarium appears intact. The mastoid air cells
are clear. There is patchy sinus disease in the left sphenoid sinus.
There is mild mucosal thickening in several ethmoid sinuses.
IMPRESSION: Paranasal sinus disease as noted above. Study otherwise
unremarkable. In particular, no evidence of intracranial mass,
hemorrhage, or acute appearing infarct.

## 2015-04-27 ENCOUNTER — Ambulatory Visit (INDEPENDENT_AMBULATORY_CARE_PROVIDER_SITE_OTHER): Payer: Medicare Other | Admitting: Internal Medicine

## 2015-04-27 VITALS — BP 118/70 | HR 64 | Temp 97.9°F | Resp 12 | Ht 60.0 in | Wt 131.0 lb

## 2015-04-27 DIAGNOSIS — M75122 Complete rotator cuff tear or rupture of left shoulder, not specified as traumatic: Secondary | ICD-10-CM | POA: Diagnosis not present

## 2015-04-27 DIAGNOSIS — M7582 Other shoulder lesions, left shoulder: Secondary | ICD-10-CM | POA: Diagnosis not present

## 2015-04-27 DIAGNOSIS — E038 Other specified hypothyroidism: Secondary | ICD-10-CM

## 2015-04-27 DIAGNOSIS — M12812 Other specific arthropathies, not elsewhere classified, left shoulder: Secondary | ICD-10-CM

## 2015-04-27 DIAGNOSIS — R5383 Other fatigue: Secondary | ICD-10-CM | POA: Diagnosis not present

## 2015-04-27 DIAGNOSIS — E559 Vitamin D deficiency, unspecified: Secondary | ICD-10-CM

## 2015-04-27 DIAGNOSIS — R42 Dizziness and giddiness: Secondary | ICD-10-CM

## 2015-04-27 DIAGNOSIS — E034 Atrophy of thyroid (acquired): Secondary | ICD-10-CM

## 2015-04-27 DIAGNOSIS — M75102 Unspecified rotator cuff tear or rupture of left shoulder, not specified as traumatic: Secondary | ICD-10-CM

## 2015-04-27 DIAGNOSIS — Z1239 Encounter for other screening for malignant neoplasm of breast: Secondary | ICD-10-CM

## 2015-04-27 DIAGNOSIS — M12512 Traumatic arthropathy, left shoulder: Secondary | ICD-10-CM

## 2015-04-27 LAB — COMPREHENSIVE METABOLIC PANEL
ALK PHOS: 63 U/L (ref 39–117)
ALT: 16 U/L (ref 0–35)
AST: 24 U/L (ref 0–37)
Albumin: 4.3 g/dL (ref 3.5–5.2)
BILIRUBIN TOTAL: 0.6 mg/dL (ref 0.2–1.2)
BUN: 15 mg/dL (ref 6–23)
CALCIUM: 9.5 mg/dL (ref 8.4–10.5)
CO2: 28 mEq/L (ref 19–32)
Chloride: 106 mEq/L (ref 96–112)
Creatinine, Ser: 0.73 mg/dL (ref 0.40–1.20)
GFR: 83.76 mL/min (ref >60.00)
GLUCOSE: 99 mg/dL (ref 70–99)
Potassium: 3.9 mEq/L (ref 3.5–5.1)
Sodium: 141 mEq/L (ref 135–145)
TOTAL PROTEIN: 7.1 g/dL (ref 6.0–8.3)

## 2015-04-27 LAB — TSH: TSH: 9.15 u[IU]/mL — ABNORMAL HIGH (ref 0.35–4.50)

## 2015-04-27 LAB — VITAMIN D 25 HYDROXY (VIT D DEFICIENCY, FRACTURES): VITD: 55.7 ng/mL (ref 30.00–100.00)

## 2015-04-27 MED ORDER — ZOLPIDEM TARTRATE 5 MG PO TABS: 5.0000 mg | ORAL_TABLET | Freq: Every evening | ORAL | Status: DC | PRN

## 2015-04-27 NOTE — Progress Notes (Signed)
Subjective:  Patient ID: Courtney Jensen, female    DOB: September 14, 1945  Age: 70 y.o. MRN: [id number]  CC: The primary encounter diagnosis was Vitamin D deficiency. Diagnoses of Hypothyroidism due to acquired atrophy of thyroid, Other fatigue, Breast cancer screening, Left rotator cuff tear arthropathy, and Dizziness and giddiness were also pertinent to this visit.  HPI Courtney Jensen presents for follow up on hypothyroid and hyperlipidemia, as well as acute issues.   Reporting some orthostatic symptoms  That have been occurring for the past week or more. . BP checked and normal  Had  Shoulder surgery on the left by Poggi in November followed by PT .. 3 of 4 RT muscles had been completely torn  And reattached.    Takes Armour thyroid,  Now followed by me. Needs TSH. Intolerant to levothyroxine . Paying out of pocket  $90/month   Not taking Vit D  Since last level was 102  On mega doses  Did not tolerate trazodone  bc of excessive dry mouth , not  Sleeping well.  Outpatient Prescriptions Prior to Visit  Medication Sig Dispense Refill  . thyroid (ARMOUR THYROID) 60 MG tablet Take 60 mg by mouth daily.    . valACYclovir (VALTREX) 500 MG tablet Take 1 tablet (500 mg total) by mouth as directed. 2 tablets in AM, 1 at night 270 tablet 4  . sertraline (ZOLOFT) 50 MG tablet Take 25 mg by mouth daily with lunch. Reported on 04/27/2015    . Cholecalciferol (VITAMIN D3) 1000000 UNIT/GM LIQD 10,000 Units by Does not apply route daily. Reported on 04/27/2015    . Omega-3 Fatty Acids (FISH OIL PO) Take 2 capsules by mouth daily. Reported on 04/27/2015    . ondansetron (ZOFRAN ODT) 4 MG disintegrating tablet Take 1 tablet (4 mg total) by mouth every 8 (eight) hours as needed for nausea or vomiting. 20 tablet 1  . oxyCODONE (ROXICODONE) 5 MG immediate release tablet Take 1-2 tablets (5-10 mg total) by mouth every 4 (four) hours as needed for severe pain. 60 tablet 0  . traZODone (DESYREL) 50 MG tablet Take  0.5-1 tablets (25-50 mg total) by mouth at bedtime as needed for sleep. (Patient not taking: Reported on 04/27/2015) 30 tablet 3   No facility-administered medications prior to visit.    Review of Systems;  Patient denies headache, fevers, malaise, unintentional weight loss, skin rash, eye pain, sinus congestion and sinus pain, sore throat, dysphagia,  hemoptysis , cough, dyspnea, wheezing, chest pain, palpitations, orthopnea, edema, abdominal pain, nausea, melena, diarrhea, constipation, flank pain, dysuria, hematuria, urinary  Frequency, nocturia, numbness, tingling, seizures,  Focal weakness, Loss of consciousness,  Tremor, insomnia, depression, anxiety, and suicidal ideation.      Objective:  BP 118/70 mmHg  Pulse 64  Temp(Src) 97.9 F (36.6 C) (Oral)  Resp 12  Ht 5' (1.524 m)  Wt 131 lb (59.421 kg)  BMI 25.58 kg/m2  SpO2 98%  BP Readings from Last 3 Encounters:  04/27/15 118/70  12/02/14 150/89  11/18/14 126/67    Wt Readings from Last 3 Encounters:  04/27/15 131 lb (59.421 kg)  12/02/14 118 lb (53.524 kg)  11/18/14 118 lb (53.524 kg)    General appearance: alert, cooperative and appears stated age Ears: normal TM's and external ear canals both ears Throat: lips, mucosa, and tongue normal; teeth and gums normal Neck: no adenopathy, no carotid bruit, supple, symmetrical, trachea midline and thyroid not enlarged, symmetric, no tenderness/mass/nodules Back: symmetric, no curvature. ROM normal. No CVA  tenderness. Lungs: clear to auscultation bilaterally Heart: regular rate and rhythm, S1, S2 normal, no murmur, click, rub or gallop Abdomen: soft, non-tender; bowel sounds normal; no masses,  no organomegaly Pulses: 2+ and symmetric Skin: Skin color, texture, turgor normal. No rashes or lesions Lymph nodes: Cervical, supraclavicular, and axillary nodes normal. Neuro: CNs 2-12 intact. DTRs 2+/4 in biceps, brachioradialis, patellars and achilles. Muscle strength 5/5 in upper  and lower exremities. Fine resting tremor bilaterally both hands cerebellar function normal. Romberg negative.  No pronator drift.   Gait normal.    No results found for: HGBA1C  Lab Results  Component Value Date   CREATININE 0.73 04/27/2015   CREATININE 0.77 12/01/2014   CREATININE 0.76 01/20/2014    Lab Results  Component Value Date   WBC 4.4 12/01/2014   HGB 13.6 12/01/2014   HCT 41.0 12/01/2014   PLT 144.0* 12/01/2014   GLUCOSE 99 04/27/2015   CHOL 211* 12/01/2014   TRIG 53.0 12/01/2014   HDL 99.70 12/01/2014   LDLDIRECT 102.6 11/26/2007   LDLCALC 101* 12/01/2014   ALT 16 04/27/2015   AST 24 04/27/2015   NA 141 04/27/2015   K 3.9 04/27/2015   CL 106 04/27/2015   CREATININE 0.73 04/27/2015   BUN 15 04/27/2015   CO2 28 04/27/2015   TSH 9.15* 04/27/2015    No results found.  Assessment & Plan:   Problem List Items Addressed This Visit    Hypothyroidism    Managed with Armour due to recurrent rash  with levothyroxine. Thyroid is underactive currently,  Will increase dose to 70 mg daily    Lab Results  Component Value Date   TSH 9.15* 04/27/2015         Relevant Medications   thyroid (ARMOUR THYROID) 90 MG tablet   Other Relevant Orders   TSH (Completed)   Left rotator cuff tear arthropathy    S/p arthroscopy surgery       Dizziness and giddiness    No neurologic deficits on exam. advised her to use sinus flush/decongestatn regularly,        Other Visit Diagnoses    Vitamin D deficiency    -  Primary    Relevant Orders    VITAMIN D 25 Hydroxy (Vit-D Deficiency, Fractures) (Completed)    Other fatigue        Relevant Orders    Comprehensive metabolic panel (Completed)    Breast cancer screening        Relevant Orders    MM DIGITAL SCREENING BILATERAL      A total of 25 minutes of face to face time was spent with patient more than half of which was spent in counselling about the above mentioned conditions  and coordination of care  I have  discontinued Ms. Golinski's Vitamin D3, traZODone, Omega-3 Fatty Acids (FISH OIL PO), oxyCODONE, and ondansetron. I am also having her start on zolpidem and thyroid. Additionally, I am having her maintain her sertraline, thyroid, and valACYclovir.  Meds ordered this encounter  Medications  . zolpidem (AMBIEN) 5 MG tablet    Sig: Take 1 tablet (5 mg total) by mouth at bedtime as needed for sleep.    Dispense:  30 tablet    Refill:  5  . thyroid (ARMOUR THYROID) 90 MG tablet    Sig: Take 1 tablet (90 mg total) by mouth daily.    Dispense:  90 tablet    Refill:  1    Medications Discontinued During This Encounter  Medication  Reason  . oxyCODONE (ROXICODONE) 5 MG immediate release tablet Completed Course  . ondansetron (ZOFRAN ODT) 4 MG disintegrating tablet Completed Course  . Omega-3 Fatty Acids (FISH OIL PO) Error  . Cholecalciferol (VITAMIN D3) 1000000 UNIT/GM LIQD Error  . traZODone (DESYREL) 50 MG tablet     Follow-up: No Follow-up on file.   Crecencio Mc, MD

## 2015-04-27 NOTE — Progress Notes (Signed)
Pre-visit discussion using our clinic review tool. No additional management support is needed unless otherwise documented below in the visit note.  

## 2015-04-27 NOTE — Patient Instructions (Signed)
Your dizziness may be due to inner ear problems due to congestion.  Below are the generic names of the new anthistamines that are all available OTC   They all are available with a Decongestant (they will have a  "D" at the end of the it  )  Which will keep you up at night so take it in the morning only   generic zyrtec, which is cetirizine.  Allegra is available generically as fexofenadine and it comes in 60 mg and 180 mg once daily strengths.  The claritin is also available generically as loratidine .

## 2015-04-28 ENCOUNTER — Encounter: Payer: Self-pay | Admitting: Internal Medicine

## 2015-04-28 DIAGNOSIS — R42 Dizziness and giddiness: Secondary | ICD-10-CM | POA: Insufficient documentation

## 2015-04-28 MED ORDER — THYROID 90 MG PO TABS
90.0000 mg | ORAL_TABLET | Freq: Every day | ORAL | Status: DC
Start: 2015-04-28 — End: 2015-10-23

## 2015-04-28 NOTE — Assessment & Plan Note (Signed)
Excellent HDL:LDl ratio.  No meds needed   Lab Results  Component Value Date   CHOL 211* 12/01/2014   HDL 99.70 12/01/2014   LDLCALC 101* 12/01/2014   LDLDIRECT 102.6 11/26/2007   TRIG 53.0 12/01/2014   CHOLHDL 2 12/01/2014

## 2015-04-28 NOTE — Assessment & Plan Note (Signed)
No neurologic deficits on exam. advised her to use sinus flush/decongestatn regularly,

## 2015-04-28 NOTE — Assessment & Plan Note (Signed)
Managed with Armour due to recurrent rash  with levothyroxine. Thyroid is underactive currently,  Will increase dose to 70 mg daily    Lab Results  Component Value Date   TSH 9.15* 04/27/2015

## 2015-04-28 NOTE — Assessment & Plan Note (Signed)
S/p arthroscopy surgery

## 2015-04-30 ENCOUNTER — Encounter: Payer: Self-pay | Admitting: Internal Medicine

## 2015-04-30 ENCOUNTER — Other Ambulatory Visit: Payer: Self-pay | Admitting: Internal Medicine

## 2015-04-30 DIAGNOSIS — E034 Atrophy of thyroid (acquired): Secondary | ICD-10-CM

## 2015-05-27 ENCOUNTER — Telehealth: Payer: Self-pay | Admitting: Internal Medicine

## 2015-05-27 DIAGNOSIS — B029 Zoster without complications: Secondary | ICD-10-CM

## 2015-05-27 DIAGNOSIS — A6000 Herpesviral infection of urogenital system, unspecified: Secondary | ICD-10-CM

## 2015-05-27 MED ORDER — SERTRALINE HCL 50 MG PO TABS
25.0000 mg | ORAL_TABLET | Freq: Every day | ORAL | Status: DC
Start: 2015-05-27 — End: 2015-11-30

## 2015-05-27 MED ORDER — VALACYCLOVIR HCL 500 MG PO TABS: 500.0000 mg | ORAL_TABLET | ORAL | Status: DC

## 2015-05-27 NOTE — Telephone Encounter (Signed)
Script sent electronically and patient notified.

## 2015-05-27 NOTE — Telephone Encounter (Signed)
Pt called in about needing a refill for sertraline (ZOLOFT) 50 MG tablet and valACYclovir (VALTREX) 500 MG tablet. Pt needs a 90 day supply. Pharmacy is [facility], [place]. Call pt @ [phone]. Thank you!

## 2015-06-15 ENCOUNTER — Other Ambulatory Visit: Payer: Medicare Other

## 2015-06-16 ENCOUNTER — Other Ambulatory Visit: Payer: Medicare Other

## 2015-06-16 DIAGNOSIS — E038 Other specified hypothyroidism: Secondary | ICD-10-CM | POA: Diagnosis not present

## 2015-06-16 DIAGNOSIS — E034 Atrophy of thyroid (acquired): Secondary | ICD-10-CM

## 2015-06-17 LAB — T4 AND TSH
T4, Total: 4.1 ug/dL — ABNORMAL LOW (ref 4.5–12.0)
TSH: 0.979 u[IU]/mL (ref 0.450–4.500)

## 2015-06-18 ENCOUNTER — Encounter: Payer: Self-pay | Admitting: Internal Medicine

## 2015-07-06 ENCOUNTER — Ambulatory Visit
Admission: RE | Admit: 2015-07-06 | Discharge: 2015-07-06 | Disposition: A | Discharge status: Home / Self Care | Payer: Medicare Other | Source: Ambulatory Visit | Attending: Internal Medicine | Admitting: Internal Medicine

## 2015-07-06 ENCOUNTER — Other Ambulatory Visit: Payer: Self-pay | Admitting: Internal Medicine

## 2015-07-06 DIAGNOSIS — Z1231 Encounter for screening mammogram for malignant neoplasm of breast: Secondary | ICD-10-CM

## 2015-07-06 DIAGNOSIS — Z1239 Encounter for other screening for malignant neoplasm of breast: Secondary | ICD-10-CM

## 2015-08-03 ENCOUNTER — Telehealth: Payer: Self-pay | Admitting: *Deleted

## 2015-08-03 NOTE — Telephone Encounter (Signed)
Pt. Returned call ,she's aware  of Dr.Tullo statement.

## 2015-08-03 NOTE — Telephone Encounter (Signed)
Last Thyroid labs were in May of this year.  Please advise if needed. thanks

## 2015-08-03 NOTE — Telephone Encounter (Signed)
Patient stated that she would like to have a lab order to have her thyroid checked, she has symptoms of a itchy face and scalp. Pt contact [phone]

## 2015-08-03 NOTE — Telephone Encounter (Signed)
Was just checked in May,  Does not need to have it redone so soon .

## 2015-08-03 NOTE — Telephone Encounter (Signed)
Left message for patient, that labs are not needed at this time. thanks

## 2015-10-01 ENCOUNTER — Ambulatory Visit (INDEPENDENT_AMBULATORY_CARE_PROVIDER_SITE_OTHER): Payer: Medicare Other | Admitting: Internal Medicine

## 2015-10-01 ENCOUNTER — Ambulatory Visit (INDEPENDENT_AMBULATORY_CARE_PROVIDER_SITE_OTHER): Payer: Medicare Other

## 2015-10-01 ENCOUNTER — Encounter: Payer: Self-pay | Admitting: Internal Medicine

## 2015-10-01 VITALS — BP 120/74 | HR 66 | Temp 98.2°F | Resp 12 | Ht 60.0 in | Wt 131.2 lb

## 2015-10-01 DIAGNOSIS — R938 Abnormal findings on diagnostic imaging of other specified body structures: Secondary | ICD-10-CM | POA: Diagnosis not present

## 2015-10-01 DIAGNOSIS — E034 Atrophy of thyroid (acquired): Secondary | ICD-10-CM

## 2015-10-01 DIAGNOSIS — R5383 Other fatigue: Secondary | ICD-10-CM | POA: Diagnosis not present

## 2015-10-01 DIAGNOSIS — E038 Other specified hypothyroidism: Secondary | ICD-10-CM | POA: Diagnosis not present

## 2015-10-01 DIAGNOSIS — R918 Other nonspecific abnormal finding of lung field: Secondary | ICD-10-CM

## 2015-10-01 DIAGNOSIS — I700 Atherosclerosis of aorta: Secondary | ICD-10-CM

## 2015-10-01 DIAGNOSIS — R232 Flushing: Secondary | ICD-10-CM

## 2015-10-01 LAB — CBC WITH DIFFERENTIAL/PLATELET
Basophils Absolute: 0 10*3/uL (ref 0.0–0.1)
Basophils Relative: 0.4 % (ref 0.0–3.0)
EOS ABS: 0.2 10*3/uL (ref 0.0–0.7)
Eosinophils Relative: 3.3 % (ref 0.0–5.0)
HCT: 39.2 % (ref 36.0–46.0)
HEMOGLOBIN: 13.1 g/dL (ref 12.0–15.0)
LYMPHS ABS: 2.2 10*3/uL (ref 0.7–4.0)
Lymphocytes Relative: 46.5 % — ABNORMAL HIGH (ref 12.0–46.0)
MCHC: 33.5 g/dL (ref 30.0–36.0)
MCV: 92.8 fl (ref 78.0–100.0)
MONO ABS: 0.4 10*3/uL (ref 0.1–1.0)
Monocytes Relative: 9.2 % (ref 3.0–12.0)
NEUTROS PCT: 40.6 % — AB (ref 43.0–77.0)
Neutro Abs: 1.9 10*3/uL (ref 1.4–7.7)
Platelets: 163 10*3/uL (ref 150.0–400.0)
RBC: 4.23 Mil/uL (ref 3.87–5.11)
RDW: 12.6 % (ref 11.5–15.5)
WBC: 4.8 10*3/uL (ref 4.0–10.5)

## 2015-10-01 NOTE — Patient Instructions (Signed)
I suggest daily use of  Plain Allegra, Zyrtec or Claritin (for itching)  , and adding 25 mg dipenhydramine (BenADRYL)  FOR NIGHTTIME SYMPTOMS   If your thyroid function is normal,  We can try an alternative form of thyroid supplementation   Chest x ray to make sure the increased flushing is not due to other causes (Infection, lymphoma, etc)

## 2015-10-01 NOTE — Progress Notes (Signed)
Subjective:  Patient ID: Courtney Jensen, female    DOB: [birth year]  Age: 70 y.o. MRN: [id number]  CC: The primary encounter diagnosis was Facial flushing. Diagnoses of Other fatigue, Hypothyroidism due to acquired atrophy of thyroid, Abnormal chest x-ray with multiple nodules, and Aortic atherosclerosis ([place]) were also pertinent to this visit.  HPI Courtney Jensen presents for new onset fatigue accompanied by hot flashes and dizzy spells   Has been getting thyroid dose increased , last time  In March for weight gain accompanied by TSH of 9. Currently taking armour thyroid 90 mg ,since May when TSH had improved. And she initially felt  much better .  Since then has been feeling foggy, fatigue in the morning , some days worse than others,  Sleep is errratic in spite of using ambien nightly .  Goes to sleep ok but has 2  wakings due to nocturia and is restless after that .  Also having new onset hot flashes  Triggered by coffee .  Fatigue and hot flashes not occurring daily  But have been more intense for the past several weeks .  Some PND and mild cough . Face feels irritated a lot and dry. Feels itchy on upper back and neck.  So she  stopped using the  Moisturizer, has tried an antihistamine which has helped.   The fatigue improves by late evening.  Not exercising [place] which is often,   yp to date on colonoscopy (2015) , mammogram 2017  Lab Results  Component Value Date   TSH 0.531 10/01/2015     Outpatient Medications Prior to Visit  Medication Sig Dispense Refill  . sertraline (ZOLOFT) 50 MG tablet Take 0.5 tablets (25 mg total) by mouth daily with lunch. Reported on 04/27/2015 90 tablet 1  . thyroid (ARMOUR THYROID) 60 MG tablet Take 60 mg by mouth daily.    Marland Kitchen thyroid (ARMOUR THYROID) 90 MG tablet Take 1 tablet (90 mg total) by mouth daily. 90 tablet 1  . valACYclovir (VALTREX) 500 MG tablet Take 1 tablet (500 mg total) by mouth as  directed. 2 tablets in AM, 1 at night 270 tablet 4  . zolpidem (AMBIEN) 5 MG tablet Take 1 tablet (5 mg total) by mouth at bedtime as needed for sleep. 30 tablet 5   No facility-administered medications prior to visit.     Review of Systems;  Patient denies headache, fevers, malaise, unintentional weight loss, skin rash, eye pain, sinus congestion and sinus pain, sore throat, dysphagia,  hemoptysis ,, dyspnea, wheezing, chest pain, palpitations, orthopnea, edema, abdominal pain, nausea, melena, diarrhea, constipation, flank pain, dysuria, hematuria, urinary  Frequency, nocturia, numbness, tingling, seizures,  Focal weakness, Loss of consciousness,  Tremor, insomnia, depression, anxiety, and suicidal ideation.      Objective:  BP 120/74   Pulse 66   Temp 98.2 F (36.8 C) (Oral)   Resp 12   Ht 5' (1.524 m)   Wt 131 lb 4 oz (59.5 kg)   SpO2 96%   BMI 25.63 kg/m   BP Readings from Last 3 Encounters:  10/01/15 120/74  04/27/15 118/70  12/02/14 (!) 150/89    Wt Readings from Last 3 Encounters:  10/01/15 131 lb 4 oz (59.5 kg)  04/27/15 131 lb (59.4 kg)  12/02/14 118 lb (53.5 kg)    General appearance: alert, cooperative and appears stated age Ears: normal TM's and external ear canals both ears Throat: lips, mucosa, and tongue  normal; teeth and gums normal Neck: no adenopathy, no carotid bruit, supple, symmetrical, trachea midline and thyroid not enlarged, symmetric, no tenderness/mass/nodules Back: symmetric, no curvature. ROM normal. No CVA tenderness. Lungs: clear to auscultation bilaterally Heart: regular rate and rhythm, S1, S2 normal, no murmur, click, rub or gallop Abdomen: soft, non-tender; bowel sounds normal; no masses,  no organomegaly Pulses: 2+ and symmetric Skin: Skin color, texture, turgor normal. No rashes or lesions Lymph nodes: Cervical, supraclavicular, and axillary nodes normal.  No results found for: HGBA1C  Lab Results  Component Value Date    CREATININE 0.73 04/27/2015   CREATININE 0.77 12/01/2014   CREATININE 0.76 01/20/2014    Lab Results  Component Value Date   WBC 4.8 10/01/2015   HGB 13.1 10/01/2015   HCT 39.2 10/01/2015   PLT 163.0 10/01/2015   GLUCOSE 99 04/27/2015   CHOL 211 (H) 12/01/2014   TRIG 53.0 12/01/2014   HDL 99.70 12/01/2014   LDLDIRECT 102.6 11/26/2007   LDLCALC 101 (H) 12/01/2014   ALT 16 04/27/2015   AST 24 04/27/2015   NA 141 04/27/2015   K 3.9 04/27/2015   CL 106 04/27/2015   CREATININE 0.73 04/27/2015   BUN 15 04/27/2015   CO2 28 04/27/2015   TSH 0.531 10/01/2015    Mm Screening Breast Tomo Bilateral  Result Date: 07/07/2015 CLINICAL DATA:  Screening. EXAM: 2D DIGITAL SCREENING BILATERAL MAMMOGRAM WITH CAD AND ADJUNCT TOMO COMPARISON:  None. ACR Breast Density Category c: The breast tissue is heterogeneously dense, which may obscure small masses FINDINGS: There are no findings suspicious for malignancy. Images were processed with CAD. IMPRESSION: No mammographic evidence of malignancy. A result letter of this screening mammogram will be mailed directly to the patient. RECOMMENDATION: Screening mammogram in one year. (Code:SM-B-01Y) BI-RADS CATEGORY  1: Negative. Electronically Signed   By: Lajean Manes M.D.   On: 07/07/2015 08:28    Assessment & Plan:   Problem List Items Addressed This Visit    Hypothyroidism    Thyroid function remains normal/on the active side.  Discussed change to levothyroxine , but per chart she has a history of hives to T4, which is not listed in chart.   Lab Results  Component Value Date   TSH 0.531 10/01/2015         Relevant Orders   T4 AND TSH (Completed)   Facial flushing - Primary    Normal  CBC, but chest x ray noted innumerable nodules suggesting prior granulomatous infection .  Will have patient undergo testing for prior TB exposure  Lab Results  Component Value Date   WBC 4.8 10/01/2015   HGB 13.1 10/01/2015   HCT 39.2 10/01/2015   MCV 92.8  10/01/2015   PLT 163.0 10/01/2015         Relevant Orders   CBC with Differential/Platelet (Completed)   DG Chest 2 View (Completed)   Abnormal chest x-ray with multiple nodules    She has innumerable  1-2 mm calcified nodules throughout lung fields suggesting history of  granulomatous infecton , along with bronchitic changes consistent with smoking along with hyperinflation.  She has a history of tobacco abuse, having quit 30 years ago.  Will discuss in person and consider pulmonary workup for TB/Sarcoid.      Aortic atherosclerosis ([place])    Noted as an incidental finding on chest x ray.  Patient has a normal lipid panel in October and dose not required statin therapy   Lab Results  Component Value Date  CHOL 211 (H) 12/01/2014   HDL 99.70 12/01/2014   LDLCALC 101 (H) 12/01/2014   LDLDIRECT 102.6 11/26/2007   TRIG 53.0 12/01/2014   CHOLHDL 2 12/01/2014          Other Visit Diagnoses    Other fatigue       Relevant Orders   CBC with Differential/Platelet (Completed)   B12 (Completed)      I am having Courtney Jensen maintain her thyroid, zolpidem, thyroid, valACYclovir, and sertraline.  No orders of the defined types were placed in this encounter.   There are no discontinued medications.  Follow-up: No Follow-up on file.   Crecencio Mc, MD

## 2015-10-01 NOTE — Progress Notes (Signed)
Pre-visit discussion using our clinic review tool. No additional management support is needed unless otherwise documented below in the visit note.  

## 2015-10-02 LAB — VITAMIN B12: VITAMIN B 12: 343 pg/mL (ref 211–911)

## 2015-10-03 LAB — T4 AND TSH
T4, Total: 4.8 ug/dL (ref 4.5–12.0)
TSH: 0.531 u[IU]/mL (ref 0.450–4.500)

## 2015-10-04 ENCOUNTER — Encounter: Payer: Self-pay | Admitting: Internal Medicine

## 2015-10-04 DIAGNOSIS — I700 Atherosclerosis of aorta: Secondary | ICD-10-CM | POA: Insufficient documentation

## 2015-10-04 DIAGNOSIS — R918 Other nonspecific abnormal finding of lung field: Secondary | ICD-10-CM | POA: Insufficient documentation

## 2015-10-04 DIAGNOSIS — R232 Flushing: Secondary | ICD-10-CM | POA: Insufficient documentation

## 2015-10-04 NOTE — Assessment & Plan Note (Addendum)
Normal  CBC, but chest x ray noted innumerable nodules suggesting prior granulomatous infection .  Will have patient undergo testing for prior TB exposure  Lab Results  Component Value Date   WBC 4.8 10/01/2015   HGB 13.1 10/01/2015   HCT 39.2 10/01/2015   MCV 92.8 10/01/2015   PLT 163.0 10/01/2015

## 2015-10-04 NOTE — Assessment & Plan Note (Signed)
Advised to try a daily nonsedating  Antihistamine and resume use of good moisturizer.

## 2015-10-04 NOTE — Assessment & Plan Note (Addendum)
She has innumerable  1-2 mm calcified nodules throughout lung fields suggesting history of  granulomatous infecton , along with bronchitic changes consistent with smoking along with hyperinflation.  She has a history of tobacco abuse, having quit 30 years ago.  Will discuss in person and consider pulmonary workup for TB/Sarcoid.

## 2015-10-04 NOTE — Assessment & Plan Note (Signed)
Noted as an incidental finding on chest x ray.  Patient has a normal lipid panel in October and dose not required statin therapy   Lab Results  Component Value Date   CHOL 211 (H) 12/01/2014   HDL 99.70 12/01/2014   LDLCALC 101 (H) 12/01/2014   LDLDIRECT 102.6 11/26/2007   TRIG 53.0 12/01/2014   CHOLHDL 2 12/01/2014

## 2015-10-04 NOTE — Assessment & Plan Note (Addendum)
Thyroid function remains normal/on the active side.  Discussed change to levothyroxine , but per chart she has a history of hives to T4, which is not listed in chart.   Lab Results  Component Value Date   TSH 0.531 10/01/2015

## 2015-10-05 ENCOUNTER — Encounter: Payer: Self-pay | Admitting: Internal Medicine

## 2015-10-15 NOTE — Telephone Encounter (Signed)
Mailed unread message to patient.  

## 2015-10-23 ENCOUNTER — Other Ambulatory Visit: Payer: Self-pay | Admitting: Internal Medicine

## 2015-11-30 ENCOUNTER — Ambulatory Visit (INDEPENDENT_AMBULATORY_CARE_PROVIDER_SITE_OTHER): Payer: Medicare Other

## 2015-11-30 ENCOUNTER — Ambulatory Visit (INDEPENDENT_AMBULATORY_CARE_PROVIDER_SITE_OTHER): Payer: Medicare Other | Admitting: Internal Medicine

## 2015-11-30 ENCOUNTER — Encounter: Payer: Self-pay | Admitting: Internal Medicine

## 2015-11-30 VITALS — BP 120/70 | HR 85 | Temp 97.9°F | Wt 130.0 lb

## 2015-11-30 DIAGNOSIS — R938 Abnormal findings on diagnostic imaging of other specified body structures: Secondary | ICD-10-CM | POA: Diagnosis not present

## 2015-11-30 DIAGNOSIS — M899 Disorder of bone, unspecified: Secondary | ICD-10-CM

## 2015-11-30 DIAGNOSIS — R9389 Abnormal findings on diagnostic imaging of other specified body structures: Secondary | ICD-10-CM

## 2015-11-30 DIAGNOSIS — M949 Disorder of cartilage, unspecified: Secondary | ICD-10-CM

## 2015-11-30 DIAGNOSIS — Z23 Encounter for immunization: Secondary | ICD-10-CM

## 2015-11-30 DIAGNOSIS — E782 Mixed hyperlipidemia: Secondary | ICD-10-CM | POA: Diagnosis not present

## 2015-11-30 DIAGNOSIS — E559 Vitamin D deficiency, unspecified: Secondary | ICD-10-CM

## 2015-11-30 DIAGNOSIS — E034 Atrophy of thyroid (acquired): Secondary | ICD-10-CM | POA: Diagnosis not present

## 2015-11-30 DIAGNOSIS — R918 Other nonspecific abnormal finding of lung field: Secondary | ICD-10-CM | POA: Diagnosis not present

## 2015-11-30 DIAGNOSIS — Z780 Asymptomatic menopausal state: Secondary | ICD-10-CM

## 2015-11-30 DIAGNOSIS — I700 Atherosclerosis of aorta: Secondary | ICD-10-CM

## 2015-11-30 DIAGNOSIS — R5383 Other fatigue: Secondary | ICD-10-CM

## 2015-11-30 DIAGNOSIS — Z0000 Encounter for general adult medical examination without abnormal findings: Secondary | ICD-10-CM

## 2015-11-30 MED ORDER — ZOLPIDEM TARTRATE 5 MG PO TABS: 5.0000 mg | ORAL_TABLET | Freq: Every evening | ORAL | 5 refills | Status: DC | PRN

## 2015-11-30 NOTE — Assessment & Plan Note (Signed)
Fasting lipids ordered. Will rec statin therapy for LDL > 100

## 2015-11-30 NOTE — Assessment & Plan Note (Signed)
Repeat DEXA due checking vit d.  t7 compression fracture noted on recent chest x ray asymptomatic

## 2015-11-30 NOTE — Progress Notes (Addendum)
Patient ID: Courtney Jensen, female    DOB: January 17, 1946  Age: 70 y.o. MRN: [id number]  The patient is here for annual Medicare wellness examination and management of other chronic and acute problems.   The risk factors are reflected in the social history.  The roster of all physicians providing medical care to patient - is listed in the Snapshot section of the chart.  Activities of daily living:  The patient is 100% independent in all ADLs: dressing, toileting, feeding as well as independent mobility  Home safety : The patient has smoke detectors in the home. They wear seatbelts.  There are no firearms at home. There is no violence in the home.   There is no risks for hepatitis, STDs or HIV. There is no   history of blood transfusion. They have no travel history to infectious disease endemic areas of the world.  The patient has seen their dentist in the last six month. They have seen their eye doctor in the last year. They admit to slight hearing difficulty with regard to whispered voices and some television programs.  They have deferred audiologic testing in the last year.  They do not  have excessive sun exposure. Discussed the need for sun protection: hats, long sleeves and use of sunscreen if there is significant sun exposure.   Diet: the importance of a healthy diet is discussed. They do have a healthy diet.  The benefits of regular aerobic exercise were discussed. She walks 4 times per week ,  20 minutes.   Depression screen: there are no signs or vegative symptoms of depression- irritability, change in appetite, anhedonia, sadness/tearfullness.  Cognitive assessment: the patient manages all their financial and personal affairs and is actively engaged. They could relate day,date,year and events; recalled 2/3 objects at 3 minutes; performed clock-face test normally.  The following portions of the patient's history were reviewed and updated as appropriate: allergies, current medications, past  family history, past medical history,  past surgical history, past social history  and problem list.  Visual acuity was not assessed per patient preference since she has regular follow up with her ophthalmologist. Hearing and body mass index were assessed and reviewed.   During the course of the visit the patient was educated and counseled about appropriate screening and preventive services including : fall prevention , diabetes screening, nutrition counseling, colorectal cancer screening, and recommended immunizations.    CC: The primary encounter diagnosis was Postmenopausal estrogen deficiency. Diagnoses of Encounter for immunization, Hypothyroidism due to acquired atrophy of thyroid, Mixed hyperlipidemia, Abnormal chest x-ray, Atherosclerosis of aorta ([place]), Vitamin D deficiency, Fatigue, unspecified type, Disorder of bone and cartilage, Aortic atherosclerosis ([place]), and Abnormal chest x-ray with multiple nodules were also pertinent to this visit.    Left shoulder mostly healed,  Some restricted extreme ROM  Surgery was 1 year ago  Has resumed  Walking a lot .  But not going to the gym anymore since her surgery,  [place] second home  .   Hot flashes and dizzy spells have improved,  We did not change the thyrodi dsoe still on 90 mg Armour  Still using generic ambien  5 mg for insomnia   Stopped the zoloft after a wean . Does not need it   reviewed chest x ray that had changes consistent with emphysema , ,  TB vs sarcoid raised.  pateint symptomatic x smoker.  , and thoracic compression fracture T7  All asympotomatic   Bone  loss commented on by oral surgeon recently.  last DEXA 2009    History Courtney Jensen has a past medical history of Anxiety; Depression; Eczema; Hypothyroidism; Osteoporosis; Recurrent herpes simplex; and Thyroid disease.   She has a past surgical history that includes Carpal tunnel release (Bilateral); Abdominal hysterectomy; Bunionectomy  (Bilateral); Knee arthroscopy (Left); and Shoulder arthroscopy with open rotator cuff repair (Left, 12/02/2014).   Her family history includes Arthritis in her mother; Heart disease in her father; Hypertension in her father.She reports that she quit smoking about 31 years ago. Her smoking use included Cigarettes. She smoked 0.50 packs per day. She does not have any smokeless tobacco history on file. She reports that she drinks alcohol. She reports that she does not use drugs.  Outpatient Medications Prior to Visit  Medication Sig Dispense Refill  . ARMOUR THYROID 90 MG tablet TAKE 1 TABLET BY MOUTH DAILY 90 tablet 3  . valACYclovir (VALTREX) 500 MG tablet Take 1 tablet (500 mg total) by mouth as directed. 2 tablets in AM, 1 at night 270 tablet 4  . sertraline (ZOLOFT) 50 MG tablet Take 0.5 tablets (25 mg total) by mouth daily with lunch. Reported on 04/27/2015 90 tablet 1  . thyroid (ARMOUR THYROID) 60 MG tablet Take 60 mg by mouth daily.    Marland Kitchen zolpidem (AMBIEN) 5 MG tablet Take 1 tablet (5 mg total) by mouth at bedtime as needed for sleep. 30 tablet 5   No facility-administered medications prior to visit.     Review of Systems  Patient denies headache, fevers, malaise, unintentional weight loss, skin rash, eye pain, sinus congestion and sinus pain, sore throat, dysphagia,  hemoptysis , cough, dyspnea, wheezing, chest pain, palpitations, orthopnea, edema, abdominal pain, nausea, melena, diarrhea, constipation, flank pain, dysuria, hematuria, urinary  Frequency, nocturia, numbness, tingling, seizures,  Focal weakness, Loss of consciousness,  Tremor, insomnia, depression, anxiety, and suicidal ideation.       Objective:  BP 120/70   Pulse 85   Temp 97.9 F (36.6 C)   Wt 130 lb (59 kg)   SpO2 92%   BMI 25.39 kg/m   Physical Exam   General appearance: alert, cooperative and appears stated age Head: Normocephalic, without obvious abnormality, atraumatic Eyes: conjunctivae/corneas clear.  PERRL, EOM's intact. Fundi benign. Ears: normal TM's and external ear canals both ears Nose: Nares normal. Septum midline. Mucosa normal. No drainage or sinus tenderness. Throat: lips, mucosa, and tongue normal; teeth and gums normal Neck: no adenopathy, no carotid bruit, no JVD, supple, symmetrical, trachea midline and thyroid not enlarged, symmetric, no tenderness/mass/nodules Lungs: clear to auscultation bilaterally Breasts: normal appearance, no masses or tenderness Heart: regular rate and rhythm, S1, S2 normal, no murmur, click, rub or gallop Abdomen: soft, non-tender; bowel sounds normal; no masses,  no organomegaly Extremities: extremities normal, atraumatic, no cyanosis or edema Pulses: 2+ and symmetric Skin: Skin color, texture, turgor normal. No rashes or lesions Neurologic: Alert and oriented X 3, normal strength and tone. Normal symmetric reflexes. Normal coordination and gait.     Assessment & Plan:   Problem List Items Addressed This Visit    Hyperlipidemia   Disorder of bone and cartilage    Repeat DEXA due checking vit d.  t7 compression fracture noted on recent chest x ray asymptomatic       Abnormal chest x-ray with multiple nodules    No history of sarcoid ,  quantiferon gold test ordered.       Aortic atherosclerosis ([facility])    Fasting  lipids ordered. Will rec statin therapy for LDL > 100      Hypothyroidism   Relevant Orders   TSH    Other Visit Diagnoses    Postmenopausal estrogen deficiency    -  Primary   Relevant Orders   DG Bone Density   Encounter for immunization       Relevant Orders   Flu Vaccine QUAD 36+ mos IM (Completed)   Abnormal chest x-ray       Relevant Orders   DG Chest 2 View (Completed)   Quantiferon tb gold assay (blood)   Atherosclerosis of aorta ([facility])       Relevant Orders   Lipid panel   Vitamin D deficiency       Relevant Orders   VITAMIN D 25 Hydroxy (Vit-D Deficiency, Fractures)   Fatigue, unspecified type        Relevant Orders   Comprehensive metabolic panel      I have discontinued Ms. Nemmers's thyroid and sertraline. I am also having her maintain her valACYclovir, ARMOUR THYROID, and zolpidem.  Meds ordered this encounter  Medications  . zolpidem (AMBIEN) 5 MG tablet    Sig: Take 1 tablet (5 mg total) by mouth at bedtime as needed for sleep.    Dispense:  30 tablet    Refill:  5    Medications Discontinued During This Encounter  Medication Reason  . sertraline (ZOLOFT) 50 MG tablet   . thyroid (ARMOUR THYROID) 60 MG tablet   . zolpidem (AMBIEN) 5 MG tablet Reorder    Follow-up: Return in about 6 months (around 05/30/2016), or FASTING LABS NEEDED SOON  /ORDERED.   Crecencio Mc, MD

## 2015-11-30 NOTE — Assessment & Plan Note (Signed)
No history of sarcoid ,  quantiferon gold test ordered.

## 2015-11-30 NOTE — Patient Instructions (Signed)
Bone Density test and fasting labs have been ordered  Menopause is a normal process in which your reproductive ability comes to an end. This process happens gradually over a span of months to years, usually between the ages of 57 and 57. Menopause is complete when you have missed 12 consecutive menstrual periods. It is important to talk with your health care provider about some of the most common conditions that affect postmenopausal women, such as heart disease, cancer, and bone loss (osteoporosis). Adopting a healthy lifestyle and getting preventive care can help to promote your health and wellness. Those actions can also lower your chances of developing some of these common conditions. WHAT SHOULD I KNOW ABOUT MENOPAUSE? During menopause, you may experience a number of symptoms, such as:  Moderate-to-severe hot flashes.  Night sweats.  Decrease in sex drive.  Mood swings.  Headaches.  Tiredness.  Irritability.  Memory problems.  Insomnia. Choosing to treat or not to treat menopausal changes is an individual decision that you make with your health care provider. WHAT SHOULD I KNOW ABOUT HORMONE REPLACEMENT THERAPY AND SUPPLEMENTS? Hormone therapy products are effective for treating symptoms that are associated with menopause, such as hot flashes and night sweats. Hormone replacement carries certain risks, especially as you become older. If you are thinking about using estrogen or estrogen with progestin treatments, discuss the benefits and risks with your health care provider. WHAT SHOULD I KNOW ABOUT HEART DISEASE AND STROKE? Heart disease, heart attack, and stroke become more likely as you age. This may be due, in part, to the hormonal changes that your body experiences during menopause. These can affect how your body processes dietary fats, triglycerides, and cholesterol. Heart attack and stroke are both medical emergencies. There are many things that you can do to help prevent  heart disease and stroke:  Have your blood pressure checked at least every 1-2 years. High blood pressure causes heart disease and increases the risk of stroke.  If you are 76-31 years old, ask your health care provider if you should take aspirin to prevent a heart attack or a stroke.  Do not use any tobacco products, including cigarettes, chewing tobacco, or electronic cigarettes. If you need help quitting, ask your health care provider.  It is important to eat a healthy diet and maintain a healthy weight.  Be sure to include plenty of vegetables, fruits, low-fat dairy products, and lean protein.  Avoid eating foods that are high in solid fats, added sugars, or salt (sodium).  Get regular exercise. This is one of the most important things that you can do for your health.  Try to exercise for at least 150 minutes each week. The type of exercise that you do should increase your heart rate and make you sweat. This is known as moderate-intensity exercise.  Try to do strengthening exercises at least twice each week. Do these in addition to the moderate-intensity exercise.  Know your numbers.Ask your health care provider to check your cholesterol and your blood glucose. Continue to have your blood tested as directed by your health care provider. WHAT SHOULD I KNOW ABOUT CANCER SCREENING? There are several types of cancer. Take the following steps to reduce your risk and to catch any cancer development as early as possible. Breast Cancer  Practice breast self-awareness.  This means understanding how your breasts normally appear and feel.  It also means doing regular breast self-exams. Let your health care provider know about any changes, no matter how small.  If  you are 30 or older, have a clinician do a breast exam (clinical breast exam or CBE) every year. Depending on your age, family history, and medical history, it may be recommended that you also have a yearly breast X-ray  (mammogram).  If you have a family history of breast cancer, talk with your health care provider about genetic screening.  If you are at high risk for breast cancer, talk with your health care provider about having an MRI and a mammogram every year.  Breast cancer (BRCA) gene test is recommended for women who have family members with BRCA-related cancers. Results of the assessment will determine the need for genetic counseling and BRCA1 and for BRCA2 testing. BRCA-related cancers include these types:  Breast. This occurs in males or females.  Ovarian.  Tubal. This may also be called fallopian tube cancer.  Cancer of the abdominal or pelvic lining (peritoneal cancer).  Prostate.  Pancreatic. Cervical, Uterine, and Ovarian Cancer Your health care provider may recommend that you be screened regularly for cancer of the pelvic organs. These include your ovaries, uterus, and vagina. This screening involves a pelvic exam, which includes checking for microscopic changes to the surface of your cervix (Pap test).  For women ages 21-65, health care providers may recommend a pelvic exam and a Pap test every three years. For women ages 56-65, they may recommend the Pap test and pelvic exam, combined with testing for human papilloma virus (HPV), every five years. Some types of HPV increase your risk of cervical cancer. Testing for HPV may also be done on women of any age who have unclear Pap test results.  Other health care providers may not recommend any screening for nonpregnant women who are considered low risk for pelvic cancer and have no symptoms. Ask your health care provider if a screening pelvic exam is right for you.  If you have had past treatment for cervical cancer or a condition that could lead to cancer, you need Pap tests and screening for cancer for at least 20 years after your treatment. If Pap tests have been discontinued for you, your risk factors (such as having a new sexual partner)  need to be reassessed to determine if you should start having screenings again. Some women have medical problems that increase the chance of getting cervical cancer. In these cases, your health care provider may recommend that you have screening and Pap tests more often.  If you have a family history of uterine cancer or ovarian cancer, talk with your health care provider about genetic screening.  If you have vaginal bleeding after reaching menopause, tell your health care provider.  There are currently no reliable tests available to screen for ovarian cancer. Lung Cancer Lung cancer screening is recommended for adults 39-30 years old who are at high risk for lung cancer because of a history of smoking. A yearly low-dose CT scan of the lungs is recommended if you:  Currently smoke.  Have a history of at least 30 pack-years of smoking and you currently smoke or have quit within the past 15 years. A pack-year is smoking an average of one pack of cigarettes per day for one year. Yearly screening should:  Continue until it has been 15 years since you quit.  Stop if you develop a health problem that would prevent you from having lung cancer treatment. Colorectal Cancer  This type of cancer can be detected and can often be prevented.  Routine colorectal cancer screening usually begins at age  64 and continues through age 50.  If you have risk factors for colon cancer, your health care provider may recommend that you be screened at an earlier age.  If you have a family history of colorectal cancer, talk with your health care provider about genetic screening.  Your health care provider may also recommend using home test kits to check for hidden blood in your stool.  A small camera at the end of a tube can be used to examine your colon directly (sigmoidoscopy or colonoscopy). This is done to check for the earliest forms of colorectal cancer.  Direct examination of the colon should be repeated  every 5-10 years until age 66. However, if early forms of precancerous polyps or small growths are found or if you have a family history or genetic risk for colorectal cancer, you may need to be screened more often. Skin Cancer  Check your skin from head to toe regularly.  Monitor any moles. Be sure to tell your health care provider:  About any new moles or changes in moles, especially if there is a change in a mole's shape or color.  If you have a mole that is larger than the size of a pencil eraser.  If any of your family members has a history of skin cancer, especially at a young age, talk with your health care provider about genetic screening.  Always use sunscreen. Apply sunscreen liberally and repeatedly throughout the day.  Whenever you are outside, protect yourself by wearing long sleeves, pants, a wide-brimmed hat, and sunglasses. WHAT SHOULD I KNOW ABOUT OSTEOPOROSIS? Osteoporosis is a condition in which bone destruction happens more quickly than new bone creation. After menopause, you may be at an increased risk for osteoporosis. To help prevent osteoporosis or the bone fractures that can happen because of osteoporosis, the following is recommended:  If you are 67-104 years old, get at least 1,000 mg of calcium and at least 600 mg of vitamin D per day.  If you are older than age 45 but younger than age 44, get at least 1,200 mg of calcium and at least 600 mg of vitamin D per day.  If you are older than age 24, get at least 1,200 mg of calcium and at least 800 mg of vitamin D per day. Smoking and excessive alcohol intake increase the risk of osteoporosis. Eat foods that are rich in calcium and vitamin D, and do weight-bearing exercises several times each week as directed by your health care provider. WHAT SHOULD I KNOW ABOUT HOW MENOPAUSE AFFECTS [place]? Depression may occur at any age, but it is more common as you become older. Common symptoms of depression  include:  Low or sad mood.  Changes in sleep patterns.  Changes in appetite or eating patterns.  Feeling an overall lack of motivation or enjoyment of activities that you previously enjoyed.  Frequent crying spells. Talk with your health care provider if you think that you are experiencing depression. WHAT SHOULD I KNOW ABOUT IMMUNIZATIONS? It is important that you get and maintain your immunizations. These include:  Tetanus, diphtheria, and pertussis (Tdap) booster vaccine.  Influenza every year before the flu season begins.  Pneumonia vaccine.  Shingles vaccine. Your health care provider may also recommend other immunizations.   This information is not intended to replace advice given to you by your health care provider. Make sure you discuss any questions you have with your health care provider.   Document Released: 03/11/2005 Document Revised:  Revised: 02/07/2014 Document Reviewed: 09/19/2013 Elsevier Interactive Patient Education 2016 [employer].  

## 2015-12-01 NOTE — Assessment & Plan Note (Signed)

## 2015-12-02 ENCOUNTER — Encounter: Payer: Self-pay | Admitting: Internal Medicine

## 2015-12-07 ENCOUNTER — Telehealth: Payer: Self-pay | Admitting: Internal Medicine

## 2015-12-07 ENCOUNTER — Encounter: Payer: Self-pay | Admitting: *Deleted

## 2015-12-07 NOTE — Telephone Encounter (Signed)
Mailed letter with list of therapist.

## 2015-12-07 NOTE — Telephone Encounter (Signed)
Pt is wanting to know who you would recommend her for counseling about family issues.. Please advise

## 2015-12-07 NOTE — Telephone Encounter (Signed)
Here are the names of several well respected female therapists   Lennon Alstrom    [phone] [place]   [phone] [place] [phone]  Achilles Dunk  [phone]  Altha Harm

## 2015-12-08 ENCOUNTER — Other Ambulatory Visit (INDEPENDENT_AMBULATORY_CARE_PROVIDER_SITE_OTHER): Payer: Medicare Other

## 2015-12-08 DIAGNOSIS — R938 Abnormal findings on diagnostic imaging of other specified body structures: Secondary | ICD-10-CM | POA: Diagnosis not present

## 2015-12-08 DIAGNOSIS — E034 Atrophy of thyroid (acquired): Secondary | ICD-10-CM

## 2015-12-08 DIAGNOSIS — R9389 Abnormal findings on diagnostic imaging of other specified body structures: Secondary | ICD-10-CM

## 2015-12-08 DIAGNOSIS — I700 Atherosclerosis of aorta: Secondary | ICD-10-CM

## 2015-12-08 DIAGNOSIS — R5383 Other fatigue: Secondary | ICD-10-CM

## 2015-12-08 DIAGNOSIS — E559 Vitamin D deficiency, unspecified: Secondary | ICD-10-CM

## 2015-12-08 LAB — LIPID PANEL
CHOL/HDL RATIO: 2
CHOLESTEROL: 165 mg/dL (ref 0–200)
HDL: 72.6 mg/dL (ref >39.00)
LDL Cholesterol: 82 mg/dL (ref 0–99)
NonHDL: 92.05
TRIGLYCERIDES: 51 mg/dL (ref 0.0–149.0)
VLDL: 10.2 mg/dL (ref 0.0–40.0)

## 2015-12-08 LAB — COMPREHENSIVE METABOLIC PANEL
ALBUMIN: 4.4 g/dL (ref 3.5–5.2)
ALK PHOS: 61 U/L (ref 39–117)
ALT: 21 U/L (ref 0–35)
AST: 27 U/L (ref 0–37)
BILIRUBIN TOTAL: 0.6 mg/dL (ref 0.2–1.2)
BUN: 12 mg/dL (ref 6–23)
CALCIUM: 9.9 mg/dL (ref 8.4–10.5)
CO2: 30 mEq/L (ref 19–32)
Chloride: 105 mEq/L (ref 96–112)
Creatinine, Ser: 0.71 mg/dL (ref 0.40–1.20)
GFR: 86.33 mL/min (ref >60.00)
GLUCOSE: 85 mg/dL (ref 70–99)
Potassium: 4.4 mEq/L (ref 3.5–5.1)
Sodium: 142 mEq/L (ref 135–145)
TOTAL PROTEIN: 7.2 g/dL (ref 6.0–8.3)

## 2015-12-08 LAB — VITAMIN D 25 HYDROXY (VIT D DEFICIENCY, FRACTURES): VITD: 46.59 ng/mL (ref 30.00–100.00)

## 2015-12-08 LAB — TSH: TSH: 0.16 u[IU]/mL — ABNORMAL LOW (ref 0.35–4.50)

## 2015-12-10 ENCOUNTER — Telehealth: Payer: Self-pay | Admitting: Internal Medicine

## 2015-12-10 ENCOUNTER — Other Ambulatory Visit: Payer: Self-pay | Admitting: Internal Medicine

## 2015-12-10 DIAGNOSIS — E034 Atrophy of thyroid (acquired): Secondary | ICD-10-CM

## 2015-12-10 LAB — QUANTIFERON TB GOLD ASSAY (BLOOD)
Interferon Gamma Release Assay: NEGATIVE
Mitogen-Nil: 7.8 IU/mL
QUANTIFERON NIL VALUE: 0.06 [IU]/mL

## 2015-12-10 MED ORDER — THYROID 81.25 MG PO TABS: 81.2500 mg | ORAL_TABLET | Freq: Every day | ORAL | 0 refills | Status: DC

## 2015-12-10 MED ORDER — THYROID 60 MG PO TABS: 60.0000 mg | ORAL_TABLET | Freq: Every day | ORAL | 0 refills | Status: DC

## 2015-12-10 NOTE — Assessment & Plan Note (Signed)
Thyroid function is overactive on current dose.  Armour dose lowered to 81.25 mg daily  . We will need to recheck her thyroid function after a minimum of 6 weeks post medication change,

## 2015-12-10 NOTE — Assessment & Plan Note (Addendum)
80 mg armour thyroid is not available.  Dose of 60 mg daily,  With increase to 120 mg on Friday given.

## 2015-12-10 NOTE — Telephone Encounter (Signed)
Patient advised of below and verbalized an understanding .  She will call back to schedule 6 week lab appointment.

## 2015-12-10 NOTE — Telephone Encounter (Signed)
Dose of armour thyroid has been  reduced due to recent level showing that it is overactive (Mychart sent) [place], the 80 mg armour thyroid is not available.  I have prescribed Dose of 60 mg daily,  With increase to 120 mg on Fridays only.  Repeat Tsh in 6 weeks

## 2015-12-11 ENCOUNTER — Encounter: Payer: Self-pay | Admitting: Internal Medicine

## 2015-12-16 ENCOUNTER — Encounter: Payer: Self-pay | Admitting: Internal Medicine

## 2015-12-16 NOTE — Telephone Encounter (Signed)
Mailed unread message to patient.  

## 2016-01-22 ENCOUNTER — Other Ambulatory Visit (INDEPENDENT_AMBULATORY_CARE_PROVIDER_SITE_OTHER): Payer: Medicare Other

## 2016-01-22 DIAGNOSIS — E034 Atrophy of thyroid (acquired): Secondary | ICD-10-CM | POA: Diagnosis not present

## 2016-01-23 ENCOUNTER — Encounter: Payer: Self-pay | Admitting: Internal Medicine

## 2016-01-23 ENCOUNTER — Other Ambulatory Visit: Payer: Self-pay | Admitting: Internal Medicine

## 2016-01-23 LAB — T4 AND TSH
T4, Total: 3.2 ug/dL — ABNORMAL LOW (ref 4.5–12.0)
TSH: 2.9 u[IU]/mL (ref 0.450–4.500)

## 2016-01-23 MED ORDER — THYROID 60 MG PO TABS: 60.0000 mg | ORAL_TABLET | Freq: Every day | ORAL | 0 refills | Status: DC

## 2016-01-26 ENCOUNTER — Ambulatory Visit
Admission: RE | Admit: 2016-01-26 | Discharge: 2016-01-26 | Disposition: A | Discharge status: Home / Self Care | Payer: Medicare Other | Source: Ambulatory Visit | Attending: Internal Medicine | Admitting: Internal Medicine

## 2016-01-26 DIAGNOSIS — E2839 Other primary ovarian failure: Secondary | ICD-10-CM | POA: Diagnosis not present

## 2016-01-26 DIAGNOSIS — M85852 Other specified disorders of bone density and structure, left thigh: Secondary | ICD-10-CM | POA: Diagnosis not present

## 2016-01-26 DIAGNOSIS — M8588 Other specified disorders of bone density and structure, other site: Secondary | ICD-10-CM | POA: Diagnosis not present

## 2016-01-26 DIAGNOSIS — Z780 Asymptomatic menopausal state: Secondary | ICD-10-CM | POA: Diagnosis not present

## 2016-01-27 ENCOUNTER — Encounter: Payer: Self-pay | Admitting: Internal Medicine

## 2016-01-27 NOTE — Assessment & Plan Note (Signed)
T scpre -1/7  Spine .  15% 10 yr risk rec repeat in  2 years

## 2016-06-03 ENCOUNTER — Other Ambulatory Visit: Payer: Self-pay | Admitting: Internal Medicine

## 2016-06-03 DIAGNOSIS — Z1231 Encounter for screening mammogram for malignant neoplasm of breast: Secondary | ICD-10-CM

## 2016-06-07 ENCOUNTER — Telehealth: Payer: Self-pay | Admitting: Internal Medicine

## 2016-06-07 NOTE — Telephone Encounter (Signed)
Left message for patient to return call to office, needs appointment with PCP patient has Quality metric Gaps open in care.

## 2016-06-07 NOTE — Telephone Encounter (Signed)
Absolutely. You do not have to ask!!

## 2016-06-07 NOTE — Telephone Encounter (Signed)
Patient has 5 open Quality Metric gaps OK to schedule?

## 2016-06-13 NOTE — Telephone Encounter (Signed)
Please see note below about pt calling office back.

## 2016-06-13 NOTE — Telephone Encounter (Signed)
Left message to return call to office.

## 2016-06-13 NOTE — Telephone Encounter (Signed)
Pt returned office call. She is moving to [place] tomorrow. Please call her on cell phone [phone].

## 2016-06-16 NOTE — Telephone Encounter (Signed)
Left detailed message since patient is [place] out of state will she continue to FU with current PCP ( if so please schedule appointment, if not please advise nurse.)

## 2016-07-20 DIAGNOSIS — M2550 Pain in unspecified joint: Secondary | ICD-10-CM | POA: Diagnosis not present

## 2016-07-20 DIAGNOSIS — R51 Headache: Secondary | ICD-10-CM | POA: Diagnosis not present

## 2016-07-20 DIAGNOSIS — M791 Myalgia: Secondary | ICD-10-CM | POA: Diagnosis not present

## 2016-07-20 DIAGNOSIS — W57XXXA Bitten or stung by nonvenomous insect and other nonvenomous arthropods, initial encounter: Secondary | ICD-10-CM | POA: Diagnosis not present

## 2016-08-10 ENCOUNTER — Other Ambulatory Visit: Payer: Self-pay | Admitting: *Deleted

## 2016-08-10 DIAGNOSIS — B029 Zoster without complications: Secondary | ICD-10-CM

## 2016-08-10 NOTE — Telephone Encounter (Signed)
Valacyclovir   Refilled: 05/27/2015  Zolpidem  Refilled: 11/30/2015  Last OV: 11/30/2015 Next OV: not scheduled

## 2016-08-10 NOTE — Telephone Encounter (Signed)
[facility] is requesting a medication refill for zolpidem and valaclovair  The pharmacy [facility] contact [phone] Fax [phone]

## 2016-08-11 MED ORDER — ZOLPIDEM TARTRATE 5 MG PO TABS: 5.0000 mg | ORAL_TABLET | Freq: Every evening | ORAL | 5 refills | Status: DC | PRN

## 2016-08-11 MED ORDER — VALACYCLOVIR HCL 500 MG PO TABS: 500.0000 mg | ORAL_TABLET | ORAL | 4 refills | Status: DC

## 2016-08-11 NOTE — Telephone Encounter (Signed)
Refills authorized,  Ok to call in the zolpidem by phone

## 2016-08-11 NOTE — Telephone Encounter (Signed)
Rx has been called in  

## 2016-08-18 ENCOUNTER — Other Ambulatory Visit: Payer: Self-pay | Admitting: Internal Medicine

## 2016-08-18 NOTE — Telephone Encounter (Signed)
Pt called requesting a refill on her thyroid (ARMOUR THYROID) 60 MG tablet. She is just about out. Please advise, thank you!  Pharmacy - [facility], [place], [place].

## 2016-08-18 NOTE — Telephone Encounter (Signed)
Refilled: 01/23/2016 Last OV: 11/30/2015 Next OV: not scheduled

## 2016-08-19 MED ORDER — THYROID 60 MG PO TABS: 60.0000 mg | ORAL_TABLET | Freq: Every day | ORAL | 0 refills | Status: DC

## 2016-08-19 NOTE — Telephone Encounter (Signed)
Refilled

## 2016-08-29 ENCOUNTER — Other Ambulatory Visit: Payer: Self-pay

## 2016-09-14 ENCOUNTER — Ambulatory Visit: Payer: Medicare Other

## 2016-09-26 ENCOUNTER — Ambulatory Visit
Admission: RE | Admit: 2016-09-26 | Discharge: 2016-09-26 | Disposition: A | Discharge status: Home / Self Care | Payer: Medicare Other | Source: Ambulatory Visit | Attending: Internal Medicine | Admitting: Internal Medicine

## 2016-09-26 DIAGNOSIS — Z1231 Encounter for screening mammogram for malignant neoplasm of breast: Secondary | ICD-10-CM | POA: Insufficient documentation

## 2016-09-27 ENCOUNTER — Encounter: Payer: Self-pay | Admitting: Internal Medicine

## 2016-10-11 NOTE — Telephone Encounter (Signed)
Mailed unread message to patient, thanks 

## 2016-11-17 DIAGNOSIS — Z23 Encounter for immunization: Secondary | ICD-10-CM | POA: Diagnosis not present

## 2016-12-01 ENCOUNTER — Other Ambulatory Visit: Payer: Self-pay | Admitting: Internal Medicine

## 2016-12-01 NOTE — Telephone Encounter (Signed)
Pt called about her refill she only has two pills left. Thank you!

## 2016-12-05 ENCOUNTER — Ambulatory Visit: Payer: Medicare Other

## 2016-12-05 ENCOUNTER — Ambulatory Visit: Payer: Medicare Other | Admitting: Internal Medicine

## 2016-12-14 NOTE — Telephone Encounter (Signed)
Refilled 12/01/2016

## 2016-12-27 DIAGNOSIS — F419 Anxiety disorder, unspecified: Secondary | ICD-10-CM | POA: Diagnosis not present

## 2016-12-27 DIAGNOSIS — F329 Major depressive disorder, single episode, unspecified: Secondary | ICD-10-CM | POA: Diagnosis not present

## 2016-12-27 DIAGNOSIS — A6000 Herpesviral infection of urogenital system, unspecified: Secondary | ICD-10-CM | POA: Diagnosis not present

## 2016-12-27 DIAGNOSIS — E039 Hypothyroidism, unspecified: Secondary | ICD-10-CM | POA: Diagnosis not present

## 2016-12-27 DIAGNOSIS — M4850XA Collapsed vertebra, not elsewhere classified, site unspecified, initial encounter for fracture: Secondary | ICD-10-CM | POA: Diagnosis not present

## 2016-12-28 DIAGNOSIS — E559 Vitamin D deficiency, unspecified: Secondary | ICD-10-CM | POA: Diagnosis not present

## 2016-12-28 DIAGNOSIS — M4850XA Collapsed vertebra, not elsewhere classified, site unspecified, initial encounter for fracture: Secondary | ICD-10-CM | POA: Diagnosis not present

## 2016-12-28 DIAGNOSIS — E039 Hypothyroidism, unspecified: Secondary | ICD-10-CM | POA: Diagnosis not present

## 2016-12-28 DIAGNOSIS — Z79899 Other long term (current) drug therapy: Secondary | ICD-10-CM | POA: Diagnosis not present

## 2017-01-03 DIAGNOSIS — E039 Hypothyroidism, unspecified: Secondary | ICD-10-CM | POA: Diagnosis not present

## 2017-01-03 DIAGNOSIS — F419 Anxiety disorder, unspecified: Secondary | ICD-10-CM | POA: Diagnosis not present

## 2017-01-03 DIAGNOSIS — F329 Major depressive disorder, single episode, unspecified: Secondary | ICD-10-CM | POA: Diagnosis not present

## 2017-01-03 DIAGNOSIS — M8580 Other specified disorders of bone density and structure, unspecified site: Secondary | ICD-10-CM | POA: Diagnosis not present

## 2017-01-03 DIAGNOSIS — Z0000 Encounter for general adult medical examination without abnormal findings: Secondary | ICD-10-CM | POA: Diagnosis not present

## 2017-01-03 DIAGNOSIS — A6000 Herpesviral infection of urogenital system, unspecified: Secondary | ICD-10-CM | POA: Diagnosis not present

## 2017-01-03 DIAGNOSIS — D696 Thrombocytopenia, unspecified: Secondary | ICD-10-CM | POA: Diagnosis not present

## 2017-01-03 DIAGNOSIS — E785 Hyperlipidemia, unspecified: Secondary | ICD-10-CM | POA: Diagnosis not present

## 2017-01-03 DIAGNOSIS — R918 Other nonspecific abnormal finding of lung field: Secondary | ICD-10-CM | POA: Diagnosis not present

## 2017-02-21 DIAGNOSIS — R918 Other nonspecific abnormal finding of lung field: Secondary | ICD-10-CM | POA: Diagnosis not present

## 2017-03-20 DIAGNOSIS — E039 Hypothyroidism, unspecified: Secondary | ICD-10-CM | POA: Diagnosis not present

## 2017-03-20 DIAGNOSIS — D696 Thrombocytopenia, unspecified: Secondary | ICD-10-CM | POA: Diagnosis not present

## 2017-04-14 DIAGNOSIS — H524 Presbyopia: Secondary | ICD-10-CM | POA: Diagnosis not present

## 2017-04-14 DIAGNOSIS — H2513 Age-related nuclear cataract, bilateral: Secondary | ICD-10-CM | POA: Diagnosis not present

## 2017-04-14 DIAGNOSIS — H1852 Epithelial (juvenile) corneal dystrophy: Secondary | ICD-10-CM | POA: Diagnosis not present

## 2017-11-07 IMAGING — DX DG CHEST 2V
2 series · 2 of 2 positions shown · non-contrast
Comparison: October 01, 2015

CLINICAL DATA: Previous abnormal chest radiograph. History of
tobacco use

EXAM:
CHEST  2 VIEW

[chest pa]
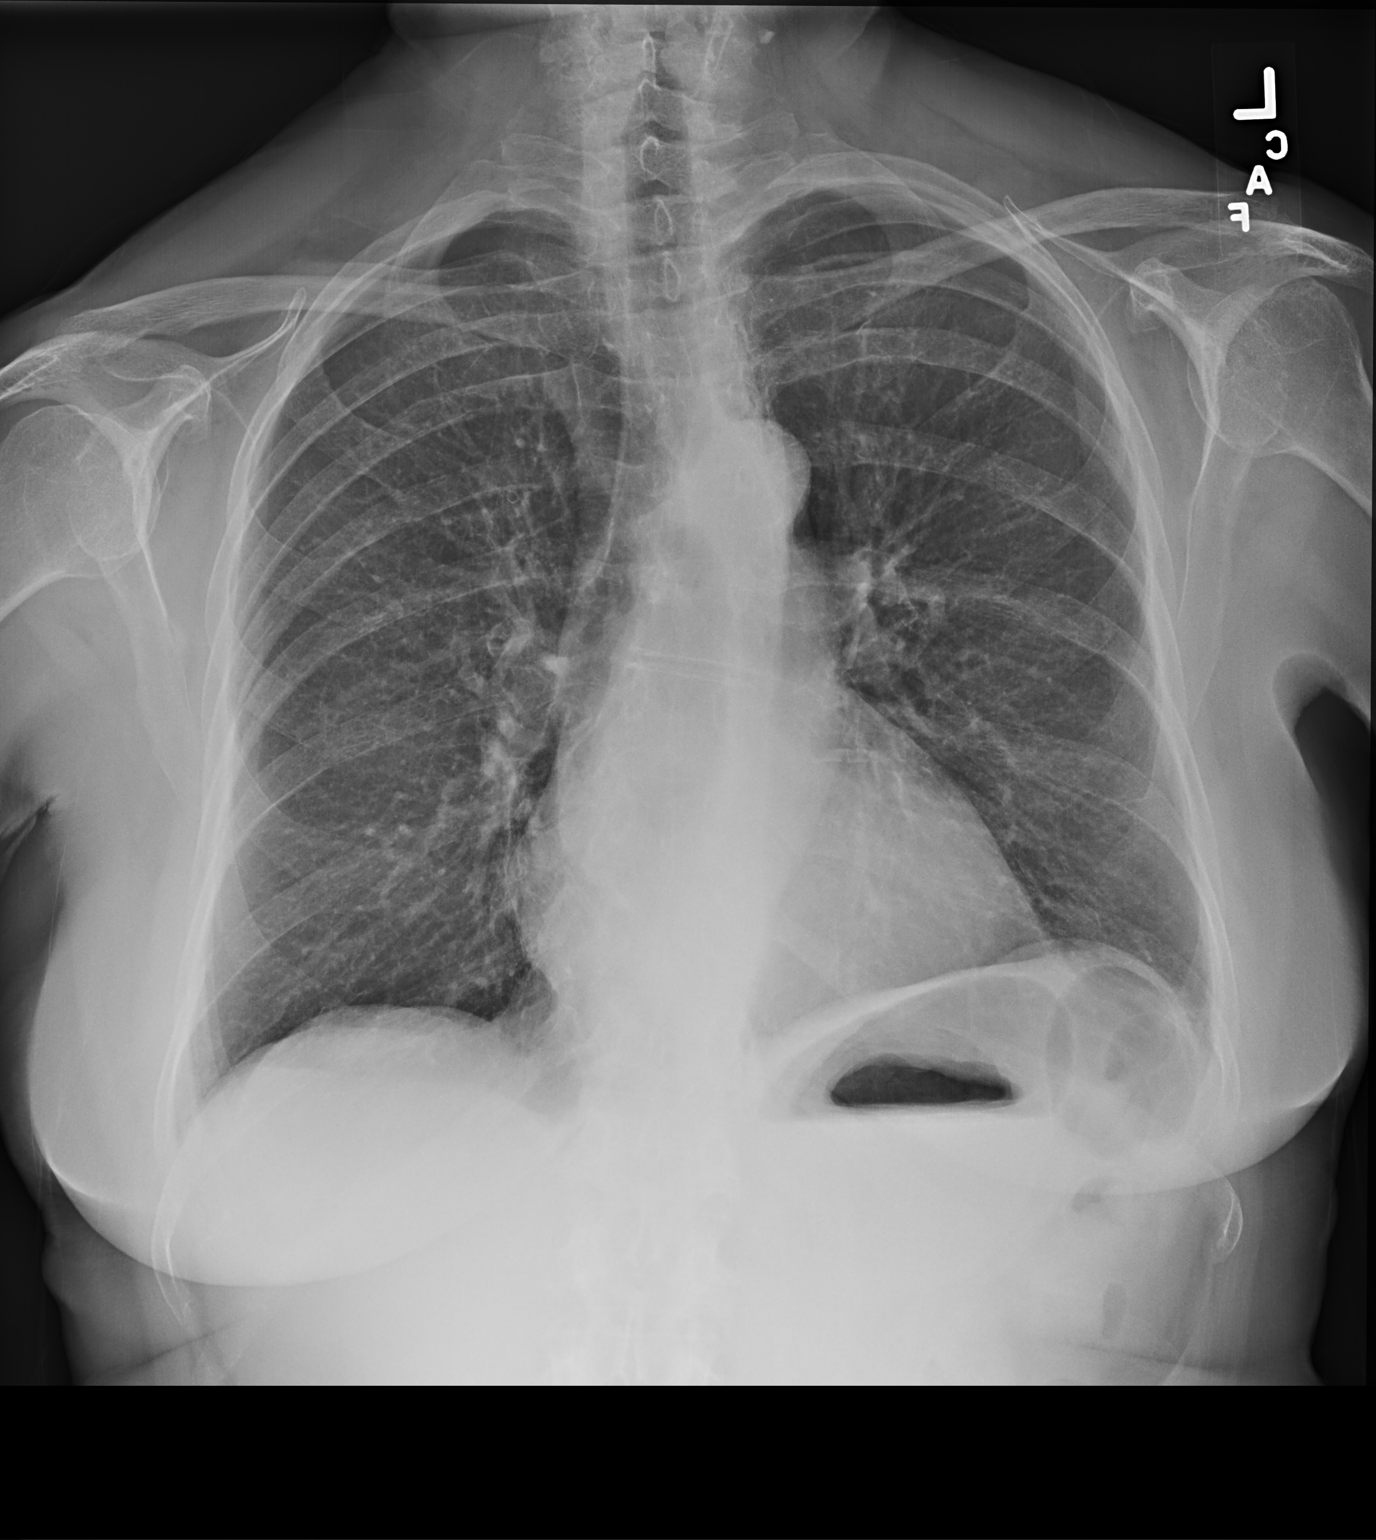

[chest lat]
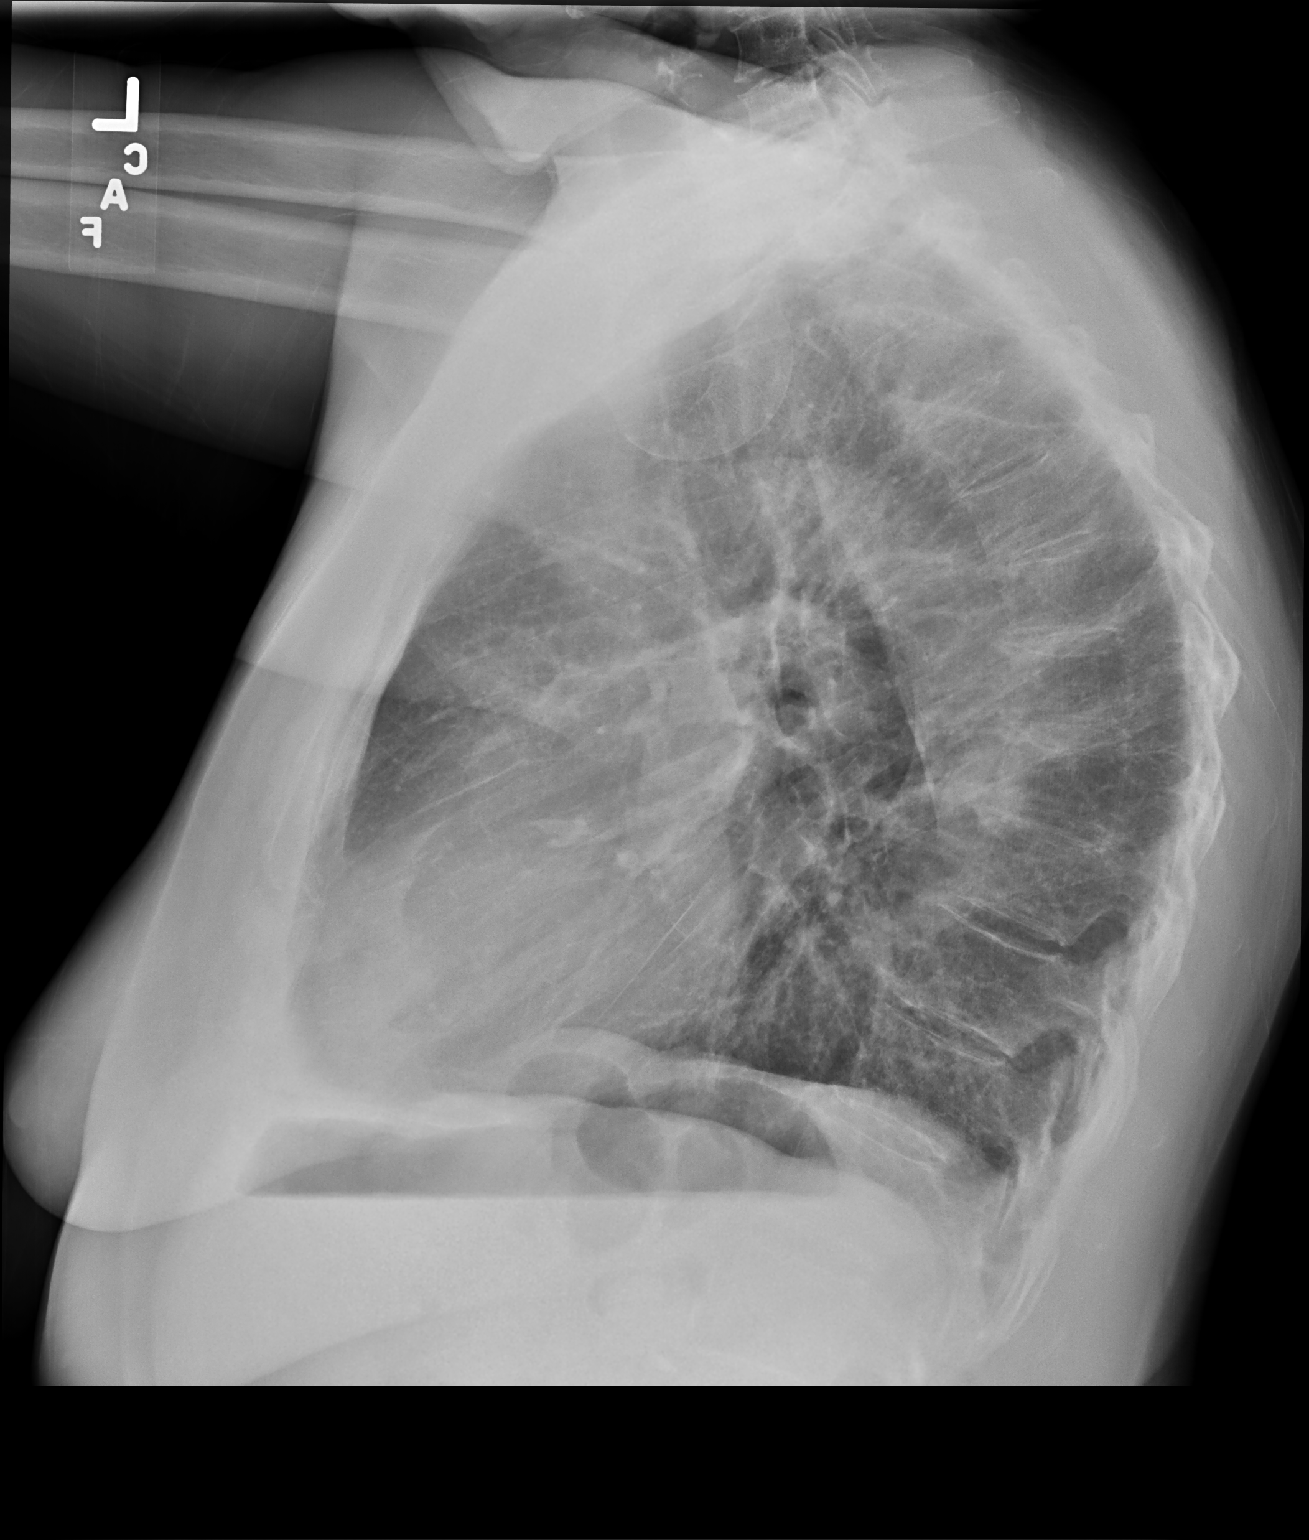

[2 of 2 positions shown; findings below may reference images not displayed]

FINDINGS: There is mild stable peribronchial thickening. There is no apparent
edema or consolidation. Heart size and pulmonary vascularity are
normal. There is atherosclerotic calcification in the aorta. There
is calcification in the left carotid artery. Mild anterior wedging
is noted involving a mid thoracic vertebral body, stable.
IMPRESSION: Evidence suggesting a degree of chronic bronchitis. No edema or
consolidation. There is aortic atherosclerosis as well as
calcification in the left carotid artery.

## 2021-03-03 LAB — HM COLONOSCOPY

## 2021-04-12 ENCOUNTER — Telehealth: Payer: Self-pay | Admitting: Internal Medicine

## 2021-04-12 NOTE — Telephone Encounter (Signed)
Pt daughter in law asked Dr. Derrel Nip is she accepting new pt. Dr. Derrel Nip put in notes "Please reestablish Itsel Opfer as a patient of mine (patient's dtr in [employer])". Dr. Derrel Nip will accept as new pt  ?

## 2021-07-16 ENCOUNTER — Encounter: Payer: Self-pay | Admitting: Internal Medicine

## 2021-07-16 ENCOUNTER — Ambulatory Visit (INDEPENDENT_AMBULATORY_CARE_PROVIDER_SITE_OTHER): Payer: Medicare Other | Admitting: Internal Medicine

## 2021-07-16 VITALS — BP 128/70 | HR 56 | Temp 97.9°F | Ht <= 58 in | Wt 118.6 lb

## 2021-07-16 DIAGNOSIS — R7301 Impaired fasting glucose: Secondary | ICD-10-CM | POA: Diagnosis not present

## 2021-07-16 DIAGNOSIS — R471 Dysarthria and anarthria: Secondary | ICD-10-CM

## 2021-07-16 DIAGNOSIS — R4781 Slurred speech: Secondary | ICD-10-CM

## 2021-07-16 DIAGNOSIS — E034 Atrophy of thyroid (acquired): Secondary | ICD-10-CM

## 2021-07-16 DIAGNOSIS — I700 Atherosclerosis of aorta: Secondary | ICD-10-CM

## 2021-07-16 DIAGNOSIS — E782 Mixed hyperlipidemia: Secondary | ICD-10-CM

## 2021-07-16 DIAGNOSIS — E538 Deficiency of other specified B group vitamins: Secondary | ICD-10-CM | POA: Diagnosis not present

## 2021-07-16 DIAGNOSIS — K52831 Collagenous colitis: Secondary | ICD-10-CM

## 2021-07-16 DIAGNOSIS — Z79899 Other long term (current) drug therapy: Secondary | ICD-10-CM

## 2021-07-16 DIAGNOSIS — M2041 Other hammer toe(s) (acquired), right foot: Secondary | ICD-10-CM

## 2021-07-16 DIAGNOSIS — M2042 Other hammer toe(s) (acquired), left foot: Secondary | ICD-10-CM

## 2021-07-16 DIAGNOSIS — Z1231 Encounter for screening mammogram for malignant neoplasm of breast: Secondary | ICD-10-CM

## 2021-07-16 LAB — LIPID PANEL
Cholesterol: 196 mg/dL (ref 0–200)
HDL: 95.6 mg/dL
LDL Cholesterol: 83 mg/dL (ref 0–99)
NonHDL: 100.22
Total CHOL/HDL Ratio: 2
Triglycerides: 87 mg/dL (ref 0.0–149.0)
VLDL: 17.4 mg/dL (ref 0.0–40.0)

## 2021-07-16 LAB — COMPREHENSIVE METABOLIC PANEL
ALT: 21 U/L (ref 0–35)
AST: 30 U/L (ref 0–37)
Albumin: 4.4 g/dL (ref 3.5–5.2)
Alkaline Phosphatase: 71 U/L (ref 39–117)
BUN: 13 mg/dL (ref 6–23)
CO2: 27 mEq/L (ref 19–32)
Calcium: 9.5 mg/dL (ref 8.4–10.5)
Chloride: 103 mEq/L (ref 96–112)
Creatinine, Ser: 0.77 mg/dL (ref 0.40–1.20)
GFR: 75.01 mL/min (ref >60.00)
Glucose, Bld: 89 mg/dL (ref 70–99)
Potassium: 4 mEq/L (ref 3.5–5.1)
Sodium: 140 mEq/L (ref 135–145)
Total Bilirubin: 0.6 mg/dL (ref 0.2–1.2)
Total Protein: 6.7 g/dL (ref 6.0–8.3)

## 2021-07-16 LAB — CBC WITH DIFFERENTIAL/PLATELET
Basophils Absolute: 0 K/uL (ref 0.0–0.1)
Basophils Relative: 0.8 % (ref 0.0–3.0)
Eosinophils Absolute: 0.1 K/uL (ref 0.0–0.7)
Eosinophils Relative: 2.3 % (ref 0.0–5.0)
HCT: 41.1 % (ref 36.0–46.0)
Hemoglobin: 13.4 g/dL (ref 12.0–15.0)
Lymphocytes Relative: 49.2 % — ABNORMAL HIGH (ref 12.0–46.0)
Lymphs Abs: 2.4 K/uL (ref 0.7–4.0)
MCHC: 32.7 g/dL (ref 30.0–36.0)
MCV: 98.9 fl (ref 78.0–100.0)
Monocytes Absolute: 0.4 K/uL (ref 0.1–1.0)
Monocytes Relative: 8.2 % (ref 3.0–12.0)
Neutro Abs: 1.9 K/uL (ref 1.4–7.7)
Neutrophils Relative %: 39.5 % — ABNORMAL LOW (ref 43.0–77.0)
Platelets: 166 K/uL (ref 150.0–400.0)
RBC: 4.15 Mil/uL (ref 3.87–5.11)
RDW: 12.6 % (ref 11.5–15.5)
WBC: 4.9 K/uL (ref 4.0–10.5)

## 2021-07-16 LAB — B12 AND FOLATE PANEL
Folate: >24.2 ng/mL (ref >5.9)
Vitamin B-12: 479 pg/mL (ref 211–911)

## 2021-07-16 LAB — TSH: TSH: 2.97 u[IU]/mL (ref 0.35–5.50)

## 2021-07-16 LAB — HEMOGLOBIN A1C: Hgb A1c MFr Bld: 5.6 % (ref 4.6–6.5)

## 2021-07-16 MED ORDER — ATORVASTATIN CALCIUM 10 MG PO TABS: 10.0000 mg | ORAL_TABLET | Freq: Every day | ORAL | 0 refills | Status: DC

## 2021-07-16 NOTE — Assessment & Plan Note (Signed)
Untreated due to previously normal lipid panels.  Noted on 20107 chest x ray along with left carotid arther atherosclerosis

## 2021-07-16 NOTE — Patient Instructions (Signed)
  The" aortic atherosclerosis"  that was noted on your last chest x ray places you at increased risk for heart attack and stroke  I recommended that you start a trial of generic Lipitor and return for  follow up liver function tests in 3 weeks    Your annual mammogram has been ordered.  You are encouraged (required) to call to make your appointment at [facility]   I have ordered and "open" MRI of your brain.  The hospital will call you to set this up

## 2021-07-16 NOTE — Progress Notes (Unsigned)
Subjective:  Patient ID: Courtney Jensen, female    DOB: [birth year]  Age: 76 y.o. MRN: [id number]  CC: The primary encounter diagnosis was Hypothyroidism due to acquired atrophy of thyroid. Diagnoses of Mixed hyperlipidemia, Long-term use of high-risk medication, Impaired fasting glucose, and Encounter for screening mammogram for malignant neoplasm of breast were also pertinent to this visit.  HPI Courtney Jensen presents for reestablishment of care .  Last seen in 2017  1) chronic diarrhea:    c dif negative , colonoscopy with biopsies positive for collagenous colitis 2023.   Diarrhea has resolved with some medication  ( unknown  ,  didn't finish it  and increased fiber .   2) gradual slowing of speech:  noted by patient.  Screened for dementia by family physician and told she was fine.  She pursued further workup. Told she had COVID induced lack of social interaction    3) Aortic atherosclerosis :  Discussed need for statin therapy given documented evidence of atherosclerosis in the aorta  and left carotid  artery noted on  2017 chest x ray and the prognostic implications of this finding.    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  Walks for exercise but pain is becoming too discouraging to walk.  Pain is worse by evening with weight bearing  relieved by sitting down    To  History Courtney Jensen has a past medical history of Anxiety, Depression, Eczema, Hypothyroidism, Osteoporosis, Recurrent herpes simplex, and Thyroid disease.   She has a past surgical history that includes Carpal tunnel release (Bilateral); Abdominal hysterectomy; Bunionectomy (Bilateral); Knee arthroscopy (Left); and Shoulder arthroscopy with open rotator cuff repair (Left, 12/02/2014).   Her family history includes Arthritis in her mother; Heart disease in her father; Hypertension in her father.She reports that she quit  smoking about 37 years ago. Her smoking use included cigarettes. She smoked an average of .5 packs per day. She does not have any smokeless tobacco history on file. She reports current alcohol use. She reports that she does not use drugs.  Outpatient Medications Prior to Visit  Medication Sig Dispense Refill   ARMOUR THYROID 60 MG tablet TAKE ONE TABLET BY MOUTH EVERY DAY IN THE MORNING, TAKE TWO TABLETS ON FRIDAY 103 tablet 0   valACYclovir (VALTREX) 500 MG tablet Take 1 tablet (500 mg total) by mouth as directed. 2 tablets in AM, 1 at night 270 tablet 4   zolpidem (AMBIEN) 5 MG tablet Take 1 tablet (5 mg total) by mouth at bedtime as needed for sleep. (Patient not taking: Reported on 07/16/2021) 30 tablet 5   No facility-administered medications prior to visit.    Review of Systems:  Patient denies headache, fevers, malaise, unintentional weight loss, skin rash, eye pain, sinus congestion and sinus pain, sore throat, dysphagia,  hemoptysis , cough, dyspnea, wheezing, chest pain, palpitations, orthopnea, edema, abdominal pain, nausea, melena, diarrhea, constipation, flank pain, dysuria, hematuria, urinary  Frequency, nocturia, numbness, tingling, seizures,  Focal weakness, Loss of consciousness,  Tremor, insomnia, depression, anxiety, and suicidal ideation.     Objective:  BP 128/70 (BP Location: Left Arm, Patient Position: Sitting, Cuff Size: Normal)   Pulse (!) 56   Temp 97.9 F (36.6 C) (Oral)   Ht 4' 9.75" (1.467 m)   Wt 118 lb 9.6 oz (53.8 kg)   SpO2 99%   BMI 25.00 kg/m  Physical Exam:   Assessment & Plan:   Problem List Items Addressed This Visit     Hypothyroidism - Primary   Hyperlipidemia   Other Visit Diagnoses     Long-term use of high-risk medication       Impaired fasting glucose       Encounter for screening mammogram for malignant neoplasm of breast           I have discontinued Courtney Jensen's zolpidem. I am also having her maintain her valACYclovir  and Armour Thyroid.  No orders of the defined types were placed in this encounter.   Medications Discontinued During This Encounter  Medication Reason   zolpidem (AMBIEN) 5 MG tablet     Follow-up: No follow-ups on file.   Courtney Mc, MD

## 2021-07-16 NOTE — Assessment & Plan Note (Signed)
Noted on 2023 colonoscopy done to evaluate chronic diarrhea

## 2021-07-18 DIAGNOSIS — R471 Dysarthria and anarthria: Secondary | ICD-10-CM | POA: Insufficient documentation

## 2021-07-18 DIAGNOSIS — M2041 Other hammer toe(s) (acquired), right foot: Secondary | ICD-10-CM | POA: Insufficient documentation

## 2021-07-18 NOTE — Assessment & Plan Note (Signed)
With bilateral bunions,  Despite prior surgeries.

## 2021-07-18 NOTE — Assessment & Plan Note (Signed)
Suspect CVA vs ongoing neurologic process.  MRI Brain  ordered

## 2021-07-22 ENCOUNTER — Encounter: Payer: Self-pay | Admitting: Internal Medicine

## 2021-07-26 ENCOUNTER — Telehealth: Payer: Self-pay

## 2021-07-26 MED ORDER — THYROID 60 MG PO TABS: ORAL_TABLET | ORAL | 0 refills | Status: DC

## 2021-07-26 NOTE — Telephone Encounter (Signed)
Received a fax from [facility] to let us know that pt has been scheduled for 75/2023 @ 9:45 am.

## 2021-07-28 ENCOUNTER — Telehealth: Payer: Self-pay | Admitting: *Deleted

## 2021-07-28 DIAGNOSIS — Z79899 Other long term (current) drug therapy: Secondary | ICD-10-CM

## 2021-07-28 NOTE — Telephone Encounter (Signed)
Lab order placed.

## 2021-07-28 NOTE — Telephone Encounter (Signed)
Please place future orders for lab appt.  

## 2021-08-06 ENCOUNTER — Other Ambulatory Visit (INDEPENDENT_AMBULATORY_CARE_PROVIDER_SITE_OTHER): Payer: Medicare Other

## 2021-08-06 DIAGNOSIS — Z79899 Other long term (current) drug therapy: Secondary | ICD-10-CM

## 2021-08-06 LAB — COMPREHENSIVE METABOLIC PANEL
ALT: 19 U/L (ref 0–35)
AST: 28 U/L (ref 0–37)
Albumin: 4.3 g/dL (ref 3.5–5.2)
Alkaline Phosphatase: 63 U/L (ref 39–117)
BUN: 16 mg/dL (ref 6–23)
CO2: 27 mEq/L (ref 19–32)
Calcium: 9.3 mg/dL (ref 8.4–10.5)
Chloride: 105 mEq/L (ref 96–112)
Creatinine, Ser: 0.76 mg/dL (ref 0.40–1.20)
GFR: 76.16 mL/min (ref >60.00)
Glucose, Bld: 88 mg/dL (ref 70–99)
Potassium: 4 mEq/L (ref 3.5–5.1)
Sodium: 140 mEq/L (ref 135–145)
Total Bilirubin: 0.8 mg/dL (ref 0.2–1.2)
Total Protein: 6.7 g/dL (ref 6.0–8.3)

## 2021-08-13 ENCOUNTER — Ambulatory Visit (INDEPENDENT_AMBULATORY_CARE_PROVIDER_SITE_OTHER): Payer: Medicare Other | Admitting: Internal Medicine

## 2021-08-13 ENCOUNTER — Encounter: Payer: Self-pay | Admitting: Internal Medicine

## 2021-08-13 VITALS — BP 124/72 | HR 50 | Temp 98.4°F | Ht <= 58 in | Wt 120.4 lb

## 2021-08-13 DIAGNOSIS — G479 Sleep disorder, unspecified: Secondary | ICD-10-CM

## 2021-08-13 DIAGNOSIS — G253 Myoclonus: Secondary | ICD-10-CM | POA: Diagnosis not present

## 2021-08-13 DIAGNOSIS — R0681 Apnea, not elsewhere classified: Secondary | ICD-10-CM | POA: Diagnosis not present

## 2021-08-13 DIAGNOSIS — R0683 Snoring: Secondary | ICD-10-CM | POA: Diagnosis not present

## 2021-08-13 DIAGNOSIS — G3184 Mild cognitive impairment, so stated: Secondary | ICD-10-CM

## 2021-08-13 DIAGNOSIS — I700 Atherosclerosis of aorta: Secondary | ICD-10-CM

## 2021-08-13 DIAGNOSIS — Z8619 Personal history of other infectious and parasitic diseases: Secondary | ICD-10-CM

## 2021-08-13 DIAGNOSIS — R413 Other amnesia: Secondary | ICD-10-CM

## 2021-08-13 DIAGNOSIS — R4189 Other symptoms and signs involving cognitive functions and awareness: Secondary | ICD-10-CM

## 2021-08-13 MED ORDER — DOXYCYCLINE HYCLATE 100 MG PO TABS: 100.0000 mg | ORAL_TABLET | Freq: Two times a day (BID) | ORAL | 0 refills | Status: DC

## 2021-08-13 NOTE — Patient Instructions (Addendum)
Your MRI did not show a stroke but did show progressive changes that occur with aging.   Referral to Pulmonology for evaluation of sleep issues  Referral to Cardiology for coronary Evaluation   Referral to Neurology for evaluation of memory changes   Your B12 and Folate levels are normal.  Your neurologist does not need to repeat them

## 2021-08-13 NOTE — Progress Notes (Unsigned)
Subjective:  Patient ID: Courtney Jensen, female    DOB: Dec 05, 1945  Age: 76 y.o. MRN: [id number]  CC: There were no encounter diagnoses.   HPI Courtney Jensen presents for follow up on recent MRI Brain done after first visit at which time she  reported a gradual  of slowing of speech . 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 Has no trouble navigating [place] .  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    Father Courtney Jensen fell recently ,  broke his nose.  Treated in ER and released    Outpatient Medications Prior to Visit  Medication Sig Dispense Refill   atorvastatin (LIPITOR) 10 MG tablet Take 1 tablet (10 mg total) by mouth daily. 90 tablet 0   thyroid (ARMOUR THYROID) 60 MG tablet TAKE ONE TABLET BY MOUTH EVERY DAY IN THE MORNING, TAKE TWO TABLETS ON FRIDAY 108 tablet 0   valACYclovir (VALTREX) 500 MG tablet Take 1 tablet (500 mg total) by mouth as directed. 2 tablets in AM, 1 at night 270 tablet 4   No facility-administered medications prior to visit.    Review of Systems;  Patient denies headache, fevers, malaise, unintentional weight loss, skin rash, eye pain, sinus congestion and sinus pain, sore throat, dysphagia,  hemoptysis , cough, dyspnea, wheezing, chest pain, palpitations, orthopnea, edema, abdominal pain, nausea, melena, diarrhea, constipation, flank pain, dysuria, hematuria, urinary  Frequency, nocturia, numbness, tingling,  seizures,  Focal weakness, Loss of consciousness,  Tremor, insomnia, depression, anxiety, and suicidal ideation.      Objective:  BP 124/72 (BP Location: Left Arm, Patient Position: Sitting, Cuff Size: Normal)   Pulse (!) 50   Temp 98.4 F (36.9 C) (Oral)   Ht 4' 9.75" (1.467 m)   Wt 120 lb 6.4 oz (54.6 kg)   SpO2 99%   BMI 25.38 kg/m   BP Readings from Last 3 Encounters:  08/13/21 124/72  07/16/21 128/70  11/30/15 120/70    Wt Readings from Last 3 Encounters:  08/13/21 120 lb 6.4 oz (54.6 kg)  07/16/21 118 lb 9.6 oz (53.8 kg)  11/30/15 130 lb (59 kg)    General appearance: alert, cooperative and appears stated age Ears: normal TM's and external ear canals both ears Throat: lips, mucosa, and tongue normal; teeth and gums normal Neck: no adenopathy, no carotid bruit, supple, symmetrical, trachea midline and thyroid not enlarged, symmetric, no tenderness/mass/nodules Back: symmetric, no curvature. ROM normal. No CVA tenderness. Lungs: clear to auscultation bilaterally Heart: regular rate and rhythm, S1, S2 normal, no murmur, click, rub or gallop Abdomen: soft, non-tender; bowel sounds normal; no masses,  no organomegaly Pulses: 2+ and symmetric Skin: Skin color, texture, turgor normal. No rashes or lesions Lymph nodes: Cervical, supraclavicular, and axillary nodes normal.  Lab Results  Component Value Date  HGBA1C 5.6 07/16/2021    Lab Results  Component Value Date   CREATININE 0.76 08/06/2021   CREATININE 0.77 07/16/2021   CREATININE 0.71 12/08/2015    Lab Results  Component Value Date   WBC 4.9 07/16/2021   HGB 13.4 07/16/2021   HCT 41.1 07/16/2021   PLT 166.0 07/16/2021   GLUCOSE 88 08/06/2021   CHOL 196 07/16/2021   TRIG 87.0 07/16/2021   HDL 95.60 07/16/2021   LDLDIRECT 102.6 11/26/2007   LDLCALC 83 07/16/2021   ALT 19 08/06/2021   AST 28 08/06/2021   NA 140 08/06/2021   K 4.0 08/06/2021   CL 105 08/06/2021   CREATININE 0.76 08/06/2021   BUN 16  08/06/2021   CO2 27 08/06/2021   TSH 2.97 07/16/2021   HGBA1C 5.6 07/16/2021    MM SCREENING BREAST TOMO BILATERAL  Result Date: 09/26/2016 CLINICAL DATA:  Screening. EXAM: 2D DIGITAL SCREENING BILATERAL MAMMOGRAM WITH CAD AND ADJUNCT TOMO COMPARISON:  Previous exam(s). ACR Breast Density Category c: The breast tissue is heterogeneously dense, which may obscure small masses. FINDINGS: There are no findings suspicious for malignancy. Images were processed with CAD. IMPRESSION: No mammographic evidence of malignancy. A result letter of this screening mammogram will be mailed directly to the patient. RECOMMENDATION: Screening mammogram in one year. (Code:[id number]) BI-RADS CATEGORY  1: Negative. Electronically Signed   By: Lajean Manes M.D.   On: 09/26/2016 16:09    Assessment & Plan:   Problem List Items Addressed This Visit   None   I spent a total of   minutes with this patient in a face to face visit on the date of this encounter reviewing the last office visit with me on        ,  most recent with patient's cardiologist in    ,  patient'ss diet and eating habits, home blood pressure readings ,  most recent imaging study ,   and post visit ordering of testing and therapeutics.    Follow-up: No follow-ups on file.   Crecencio Mc, MD

## 2021-08-14 LAB — COMPREHENSIVE METABOLIC PANEL
AG Ratio: 2 (calc) (ref 1.0–2.5)
ALT: 20 U/L (ref 6–29)
AST: 26 U/L (ref 10–35)
Albumin: 4.3 g/dL (ref 3.6–5.1)
Alkaline phosphatase (APISO): 63 U/L (ref 37–153)
BUN: 13 mg/dL (ref 7–25)
CO2: 28 mmol/L (ref 20–32)
Calcium: 9.6 mg/dL (ref 8.6–10.4)
Chloride: 108 mmol/L (ref 98–110)
Creat: 0.86 mg/dL (ref 0.60–1.00)
Globulin: 2.1 g/dL (calc) (ref 1.9–3.7)
Glucose, Bld: 101 mg/dL — ABNORMAL HIGH (ref 65–99)
Potassium: 3.7 mmol/L (ref 3.5–5.3)
Sodium: 142 mmol/L (ref 135–146)
Total Bilirubin: 0.4 mg/dL (ref 0.2–1.2)
Total Protein: 6.4 g/dL (ref 6.1–8.1)

## 2021-08-14 LAB — LIPID PANEL W/REFLEX DIRECT LDL
Cholesterol: 157 mg/dL (ref <200)
HDL: 91 mg/dL (ref >=50)
LDL Cholesterol (Calc): 49 mg/dL (calc)
Non-HDL Cholesterol (Calc): 66 mg/dL (calc) (ref <130)
Total CHOL/HDL Ratio: 1.7 (calc) (ref <5.0)
Triglycerides: 85 mg/dL (ref <150)

## 2021-08-14 LAB — B. BURGDORFI ANTIBODIES: B burgdorferi Ab IgG+IgM: <0.9 index

## 2021-08-15 DIAGNOSIS — G479 Sleep disorder, unspecified: Secondary | ICD-10-CM | POA: Insufficient documentation

## 2021-08-15 DIAGNOSIS — G3184 Mild cognitive impairment, so stated: Secondary | ICD-10-CM | POA: Insufficient documentation

## 2021-08-15 DIAGNOSIS — Z8619 Personal history of other infectious and parasitic diseases: Secondary | ICD-10-CM | POA: Insufficient documentation

## 2021-08-15 NOTE — Assessment & Plan Note (Signed)
Per husband's report.  Occurred over 5 years ago.  Antibody test is negative today

## 2021-08-15 NOTE — Assessment & Plan Note (Signed)
.    Noted on 2017 chest x ray along with left carotid artery  atherosclerosis .  She is taking atorvastatin and tolerating the medication without side effects.

## 2021-08-15 NOTE — Assessment & Plan Note (Addendum)
She was evaluated by her previous PCP in 2022 with MOCA (score 26/30 and brain MRI.  Current MRI report compared with report for 2022 study: there are no significant changes and no evidence of a prior stroke.,  Only small vessel ischemic changes.  Recommend statin therapy for primary prevention , referral to Neurology ,  And referral to pulmonology to arrange testing for OSA .  B12 and folate lefels were normal  Lab Results  Component Value Date   VITAMINB12 479 07/16/2021   Lab Results  Component Value Date   FOLATE >24.2 07/16/2021

## 2021-08-15 NOTE — Assessment & Plan Note (Signed)
Symptoms of myoclonic jerks, snoring and early waking:  Checking for OSA

## 2021-08-30 ENCOUNTER — Encounter: Payer: Medicare Other | Admitting: Primary Care

## 2021-08-30 NOTE — Progress Notes (Signed)
 This encounter was created in error - please disregard.

## 2021-08-30 NOTE — Patient Instructions (Signed)
   Sleep apnea is defined as a period of 10 seconds or longer that you stop breathing while sleeping.  This can happen several times throughout the night.  When it happens more than 5 times on average an hour this is a diagnosis of sleep apnea  Mild sleep apnea- 5-15 apneic events an hour Moderate sleep apnea- 15-30 apneic events an hour Severe sleep apnea - over 30 apneic events an hour  Risk of untreated sleep apnea include cardiac arrhythmias, stroke, pulmonary hypertension and diabetes  Treatment options include weight loss, oral appliance, CPAP therapy or referral to ENT for possible surgical options  Recommendations Maintain normal BMI Focus on side sleeping position or elevate head of bed 30 degrees Do not drive experiencing excessive daytime sleepiness or fatigue Do not take sedating medication or drink alcohol in excess prior to bedtime  Orders Sleep study  Follow-up Please call to schedule follow-up 1 to 2 weeks after completing sleep study to review results and treatment options if needed

## 2021-10-01 ENCOUNTER — Ambulatory Visit (INDEPENDENT_AMBULATORY_CARE_PROVIDER_SITE_OTHER): Payer: Medicare Other

## 2021-10-01 VITALS — Ht <= 58 in | Wt 120.0 lb

## 2021-10-01 DIAGNOSIS — Z0000 Encounter for general adult medical examination without abnormal findings: Secondary | ICD-10-CM | POA: Diagnosis not present

## 2021-10-01 NOTE — Patient Instructions (Addendum)
Courtney Jensen , Thank you for taking time to come for your Medicare Wellness Visit. I appreciate your ongoing commitment to your health goals. Please review the following plan we discussed and let me know if I can assist you in the future.   These are the goals we discussed:  Goals      Healthy lifestyle     Stay active Healthy diet        This is a list of the screening recommended for you and due dates:  Health Maintenance  Topic Date Due   COVID-19 Vaccine (3 - Moderna risk series) 10/17/2021*   Zoster (Shingles) Vaccine (1 of 2) 12/31/2021*   Flu Shot  05/01/2022*   Tetanus Vaccine  10/30/2022   Colon Cancer Screening  03/04/2031   Pneumonia Vaccine  Completed   DEXA scan (bone density measurement)  Completed   Hepatitis C Screening: USPSTF Recommendation to screen - Ages 66-79 yo.  Completed   HPV Vaccine  Aged Out  *Topic was postponed. The date shown is not the original due date.    Preventive Care 56 Years and Older, Female Preventive care refers to lifestyle choices and visits with your health care provider that can promote health and wellness. What does preventive care include? A yearly physical exam. This is also called an annual well check. Dental exams once or twice a year. Routine eye exams. Ask your health care provider how often you should have your eyes checked. Personal lifestyle choices, including: Daily care of your teeth and gums. Regular physical activity. Eating a healthy diet. Avoiding tobacco and drug use. Limiting alcohol use. Practicing safe sex. Taking low-dose aspirin every day. Taking vitamin and mineral supplements as recommended by your health care provider. What happens during an annual well check? The services and screenings done by your health care provider during your annual well check will depend on your age, overall health, lifestyle risk factors, and family history of disease. Counseling  Your health care provider may ask you  questions about your: Alcohol use. Tobacco use. Drug use. Emotional well-being. Home and relationship well-being. Sexual activity. Eating habits. History of falls. Memory and ability to understand (cognition). Work and work Statistician. Reproductive health. Screening  You may have the following tests or measurements: Height, weight, and BMI. Blood pressure. Lipid and cholesterol levels. These may be checked every 5 years, or more frequently if you are over 1 years old. Skin check. Lung cancer screening. You may have this screening every year starting at age 76 if you have a 30-pack-year history of smoking and currently smoke or have quit within the past 15 years. Fecal occult blood test (FOBT) of the stool. You may have this test every year starting at age 60. Flexible sigmoidoscopy or colonoscopy. You may have a sigmoidoscopy every 5 years or a colonoscopy every 10 years starting at age 62. Hepatitis C blood test. Hepatitis B blood test. Sexually transmitted disease (STD) testing. Diabetes screening. This is done by checking your blood sugar (glucose) after you have not eaten for a while (fasting). You may have this done every 1-3 years. Bone density scan. This is done to screen for osteoporosis. You may have this done starting at age 57. Mammogram. This may be done every 1-2 years. Talk to your health care provider about how often you should have regular mammograms. Talk with your health care provider about your test results, treatment options, and if necessary, the need for more tests. Vaccines  Your health care provider  may recommend certain vaccines, such as: Influenza vaccine. This is recommended every year. Tetanus, diphtheria, and acellular pertussis (Tdap, Td) vaccine. You may need a Td booster every 10 years. Zoster vaccine. You may need this after age 22. Pneumococcal 13-valent conjugate (PCV13) vaccine. One dose is recommended after age 31. Pneumococcal polysaccharide  (PPSV23) vaccine. One dose is recommended after age 17. Talk to your health care provider about which screenings and vaccines you need and how often you need them. This information is not intended to replace advice given to you by your health care provider. Make sure you discuss any questions you have with your health care provider. Document Released: 02/13/2015 Document Revised: 10/07/2015 Document Reviewed: 11/18/2014 Elsevier Interactive Patient Education  2017 [place] Prevention in the Home Falls can cause injuries. They can happen to people of all ages. There are many things you can do to make your home safe and to help prevent falls. What can I do on the outside of my home? Regularly fix the edges of walkways and driveways and fix any cracks. Remove anything that might make you trip as you walk through a door, such as a raised step or threshold. Trim any bushes or trees on the path to your home. Use bright outdoor lighting. Clear any walking paths of anything that might make someone trip, such as rocks or tools. Regularly check to see if handrails are loose or broken. Make sure that both sides of any steps have handrails. Any raised decks and porches should have guardrails on the edges. Have any leaves, snow, or ice cleared regularly. Use sand or salt on walking paths during winter. Clean up any spills in your garage right away. This includes oil or grease spills. What can I do in the bathroom? Use night lights. Install grab bars by the toilet and in the tub and shower. Do not use towel bars as grab bars. Use non-skid mats or decals in the tub or shower. If you need to sit down in the shower, use a plastic, non-slip stool. Keep the floor dry. Clean up any water that spills on the floor as soon as it happens. Remove soap buildup in the tub or shower regularly. Attach bath mats securely with double-sided non-slip rug tape. Do not have throw rugs and other things on the  floor that can make you trip. What can I do in the bedroom? Use night lights. Make sure that you have a light by your bed that is easy to reach. Do not use any sheets or blankets that are too big for your bed. They should not hang down onto the floor. Have a firm chair that has side arms. You can use this for support while you get dressed. Do not have throw rugs and other things on the floor that can make you trip. What can I do in the kitchen? Clean up any spills right away. Avoid walking on wet floors. Keep items that you use a lot in easy-to-reach places. If you need to reach something above you, use a strong step stool that has a grab bar. Keep electrical cords out of the way. Do not use floor polish or wax that makes floors slippery. If you must use wax, use non-skid floor wax. Do not have throw rugs and other things on the floor that can make you trip. What can I do with my stairs? Do not leave any items on the stairs. Make sure that there are handrails on both sides  of the stairs and use them. Fix handrails that are broken or loose. Make sure that handrails are as long as the stairways. Check any carpeting to make sure that it is firmly attached to the stairs. Fix any carpet that is loose or worn. Avoid having throw rugs at the top or bottom of the stairs. If you do have throw rugs, attach them to the floor with carpet tape. Make sure that you have a light switch at the top of the stairs and the bottom of the stairs. If you do not have them, ask someone to add them for you. What else can I do to help prevent falls? Wear shoes that: Do not have high heels. Have rubber bottoms. Are comfortable and fit you well. Are closed at the toe. Do not wear sandals. If you use a stepladder: Make sure that it is fully opened. Do not climb a closed stepladder. Make sure that both sides of the stepladder are locked into place. Ask someone to hold it for you, if possible. Clearly mark and make  sure that you can see: Any grab bars or handrails. First and last steps. Where the edge of each step is. Use tools that help you move around (mobility aids) if they are needed. These include: Canes. Walkers. Scooters. Crutches. Turn on the lights when you go into a dark area. Replace any light bulbs as soon as they burn out. Set up your furniture so you have a clear path. Avoid moving your furniture around. If any of your floors are uneven, fix them. If there are any pets around you, be aware of where they are. Review your medicines with your doctor. Some medicines can make you feel dizzy. This can increase your chance of falling. Ask your doctor what other things that you can do to help prevent falls. This information is not intended to replace advice given to you by your health care provider. Make sure you discuss any questions you have with your health care provider. Document Released: 11/13/2008 Document Revised: 06/25/2015 Document Reviewed: 02/21/2014 Elsevier Interactive Patient Education  2017 [employer].

## 2021-10-01 NOTE — Progress Notes (Addendum)
Subjective:   Courtney Jensen is a 76 y.o. female who presents for Medicare Annual (Subsequent) preventive examination.  Review of Systems    No ROS.  Medicare Wellness Virtual Visit.  Visual/audio telehealth visit, UTA vital signs.   See social history for additional risk factors.   Cardiac Risk Factors include: advanced age (>76mn, >>87women)     Objective:    Today's Vitals   10/01/21 1404  Weight: 120 lb (54.4 kg)  Height: 4' 9.75" (1.467 m)   Body mass index is 25.3 kg/m.     10/01/2021    2:10 PM 11/18/2014    8:36 AM  Advanced Directives  Does Patient Have a Medical Advance Directive? Yes No  Type of [place]   Does patient want to make changes to medical advance directive? No - Patient declined   Copy of [place] Chart? No - copy requested     Current Medications (verified) Outpatient Encounter Medications as of 10/01/2021  Medication Sig   atorvastatin (LIPITOR) 10 MG tablet Take 1 tablet (10 mg total) by mouth daily.   doxycycline (VIBRA-TABS) 100 MG tablet Take 1 tablet (100 mg total) by mouth 2 (two) times daily.   thyroid (ARMOUR THYROID) 60 MG tablet TAKE ONE TABLET BY MOUTH EVERY DAY IN THE MORNING, TAKE TWO TABLETS ON FRIDAY   valACYclovir (VALTREX) 500 MG tablet Take 1 tablet (500 mg total) by mouth as directed. 2 tablets in AM, 1 at night   No facility-administered encounter medications on file as of 10/01/2021.    Allergies (verified) Codeine, Corticosteroids, Levothyroxine, Oxycodone-acetaminophen, and Doxycycline   History: Past Medical History:  Diagnosis Date   Anxiety    Depression    Eczema    Hypothyroidism    Osteoporosis    Recurrent herpes simplex    Thyroid disease    Past Surgical History:  Procedure Laterality Date   ABDOMINAL HYSTERECTOMY     BUNIONECTOMY Bilateral    CARPAL TUNNEL RELEASE Bilateral    KNEE ARTHROSCOPY Left    SHOULDER ARTHROSCOPY  WITH OPEN ROTATOR CUFF REPAIR Left 12/02/2014   Procedure: left shoulder arthroscopy, arthroscopic debridement, subacromial decompression, mini open rotator cuff repair;  Surgeon: JCorky Mull MD;  Location: [facility] ORS;  Service: Orthopedics;  Laterality: Left;   Family History  Problem Relation Age of Onset   Arthritis Mother    Heart disease Father    Hypertension Father    Breast cancer Neg Hx    Social History   Socioeconomic History   Marital status: Married    Spouse name: Not on file   Number of children: Not on file   Years of education: Not on file   Highest education level: Not on file  Occupational History   Not on file  Tobacco Use   Smoking status: Former    Packs/day: 0.50    Types: Cigarettes    Quit date: 04/13/1984    Years since quitting: 37.4   Smokeless tobacco: Not on file  Substance and Sexual Activity   Alcohol use: Yes    Alcohol/week: 0.0 standard drinks of alcohol    Comment: Wine or mixed drink in the evening   Drug use: No   Sexual activity: Never  Other Topics Concern   Not on file  Social History Narrative   Not on file   Social Determinants of Health   Financial Resource Strain: Low Risk  (10/01/2021)   Overall Financial  Resource Strain (CARDIA)    Difficulty of Paying Living Expenses: Not hard at all  Food Insecurity: No Food Insecurity (10/01/2021)   Hunger Vital Sign    Worried About Running Out of Food in the Last Year: Never true    Ran Out of Food in the Last Year: Never true  Transportation Needs: No Transportation Needs (10/01/2021)   PRAPARE - Hydrologist (Medical): No    Lack of Transportation (Non-Medical): No  Physical Activity: Sufficiently Active (10/01/2021)   Exercise Vital Sign    Days of Exercise per Week: 7 days    Minutes of Exercise per Session: 50 min  Stress: No Stress Concern Present (10/01/2021)   [place]    Feeling of  Stress : Not at all  Social Connections: Not on file    Tobacco Counseling Counseling given: Not Answered   Clinical Intake:  Pre-visit preparation completed: Yes        Diabetes: No  How often do you need to have someone help you when you read instructions, pamphlets, or other written materials from your doctor or pharmacy?: 1 - Never    Interpreter Needed?: No      Activities of Daily Living    10/01/2021    2:41 PM  In your present state of health, do you have any difficulty performing the following activities:  Hearing? 0  Vision? 0  Difficulty concentrating or making decisions? 0  Walking or climbing stairs? 0  Dressing or bathing? 0  Doing errands, shopping? 0  Preparing Food and eating ? N  Using the Toilet? N  In the past six months, have you accidently leaked urine? N  Do you have problems with loss of bowel control? N  Managing your Medications? N  Managing your Finances? N  Housekeeping or managing your Housekeeping? N    Patient Care Team: Crecencio Mc, MD as PCP - General (Internal Medicine)  Indicate any recent Medical Services you may have received from other than [facility] providers in the past year (date may be approximate).     Assessment:   This is a routine wellness examination for Courtney Jensen.  Virtual Visit via Telephone Note  I connected with  [name] on 10/01/21 at  2:00 PM EDT by telephone and verified that I am speaking with the correct person using two identifiers.  Location: Patient: home Provider: office Persons participating in the virtual visit: patient/Nurse Health Advisor   I discussed the limitations of performing an evaluation and management service by telehealth.We continued and completed visit with audio only. Some vital signs may be absent or patient reported.   Hearing/Vision screen Hearing Screening - Comments:: Patient is able to hear conversational tones without difficulty.  No issues reported. Vision  Screening - Comments:: Followed by [facility] Wears reader lenses Cataract extraction, bilateral They have seen their ophthalmologist in the last 12 months.    Dietary issues and exercise activities discussed: Current Exercise Habits: Home exercise routine, Intensity: Mild Regular diet Good water intake   Goals Addressed             This Visit's Progress    Healthy lifestyle       Stay active Healthy diet       Depression Screen    10/01/2021    2:07 PM 08/13/2021    1:30 PM 07/16/2021   10:48 AM 10/27/2014    1:26 PM 10/29/2012  10:26 AM 10/29/2012   10:25 AM 04/23/2012   10:19 AM  PHQ 2/9 Scores  PHQ - 2 Score 0 0 0 0 0 0 0    Fall Risk    10/01/2021    2:41 PM 08/13/2021    1:30 PM 07/16/2021   10:48 AM 08/29/2016   11:13 AM 10/27/2014    1:26 PM  Fall Risk   Falls in the past year?  1 1 No Yes  Comment    Emmi Telephone Survey: data to providers prior to load   Number falls in past yr:  '1 1  2 '$ or more  Injury with Fall?  0 0  Yes  Comment     2 torn muscle and ligament  Risk for fall due to :  History of fall(s) History of fall(s)  Other (Comment)  Follow up Falls evaluation completed Falls evaluation completed Falls evaluation completed  Falls prevention discussed  Comment     new environment and rain wet surface    FALL RISK PREVENTION PERTAINING TO THE HOME: Home free of loose throw rugs in walkways, pet beds, electrical cords, etc? Yes  Adequate lighting in your home to reduce risk of falls? Yes   ASSISTIVE DEVICES UTILIZED TO PREVENT FALLS: Life alert? No  Use of a cane, walker or w/c? No  Grab bars in the bathroom? No  Shower chair or bench in shower? No  Elevated toilet seat or a handicapped toilet? No   TIMED UP AND GO: Was the test performed? No .   Cognitive Function:        10/01/2021    2:43 PM  6CIT Screen  What Year? 0 points  What month? 0 points  What time? 0 points  Count back from 20 0 points  Months in reverse 0  points  Repeat phrase 4 points  Total Score 4 points    Immunizations Immunization History  Administered Date(s) Administered   Influenza Whole 10/29/2007, 12/17/2008   Influenza, High Dose Seasonal PF 10/27/2014, 11/16/2020   Influenza,inj,Quad PF,6+ Mos 10/29/2012, 11/30/2015   Influenza-Unspecified 10/31/2013   Moderna Sars-Covid-2 Vaccination 04/03/2019, 05/01/2019   Pneumococcal Conjugate-13 10/27/2014   Pneumococcal Polysaccharide-23 10/27/2010   Td 12/17/2008   Tdap 10/29/2012   Zoster, Live 10/27/2006    Shingrix Completed?: No.    Education has been provided regarding the importance of this vaccine. Patient has been advised to call insurance company to determine out of pocket expense if they have not yet received this vaccine. Advised may also receive vaccine at local pharmacy or Health Dept. Verbalized acceptance and understanding.  Screening Tests Health Maintenance  Topic Date Due   COVID-19 Vaccine (3 - Moderna risk series) 10/17/2021 (Originally 05/29/2019)   Zoster Vaccines- Shingrix (1 of 2) 12/31/2021 (Originally 04/06/1964)   INFLUENZA VACCINE  05/01/2022 (Originally 08/31/2021)   TETANUS/TDAP  10/30/2022   COLONOSCOPY (Pts 45-57yr Insurance coverage will need to be confirmed)  03/04/2031   Pneumonia Vaccine 76 Years old  Completed   DEXA SCAN  Completed   Hepatitis C Screening  Completed   HPV VACCINES  Aged Out   Health Maintenance There are no preventive care reminders to display for this patient.  Lung Cancer Screening: (Low Dose CT Chest recommended if Age 76-80years, 30 pack-year currently smoking OR have quit w/in 15years.) does not qualify.   Vision Screening: Recommended annual ophthalmology exams for early detection of glaucoma and other disorders of the eye.  Dental Screening: Recommended annual dental exams for  proper oral hygiene  Community Resource Referral / Chronic Care Management: CRR required this visit?  No   CCM required this visit?   No      Plan:     I have personally reviewed and noted the following in the patient's chart:   Medical and social history Use of alcohol, tobacco or illicit drugs  Current medications and supplements including opioid prescriptions. Patient is not currently taking opioid prescriptions. Functional ability and status Nutritional status Physical activity Advanced directives List of other physicians Hospitalizations, surgeries, and ER visits in previous 12 months Vitals Screenings to include cognitive, depression, and falls Referrals and appointments  In addition, I have reviewed and discussed with patient certain preventive protocols, quality metrics, and best practice recommendations. A written personalized care plan for preventive services as well as general preventive health recommendations were provided to patient.     OBrien-Blaney, Gabbrielle Mcnicholas L, LPN   07/07/3417    I have reviewed the above information and agree with above.   Deborra Medina, MD

## 2021-10-06 ENCOUNTER — Other Ambulatory Visit: Payer: Self-pay | Admitting: Internal Medicine

## 2021-10-20 ENCOUNTER — Encounter: Payer: Self-pay | Admitting: Internal Medicine

## 2021-10-24 ENCOUNTER — Other Ambulatory Visit: Payer: Self-pay | Admitting: Internal Medicine

## 2021-11-09 ENCOUNTER — Other Ambulatory Visit: Payer: Self-pay | Admitting: Internal Medicine

## 2021-11-09 ENCOUNTER — Other Ambulatory Visit: Payer: Self-pay

## 2021-11-09 ENCOUNTER — Encounter: Payer: Self-pay | Admitting: Internal Medicine

## 2021-11-09 DIAGNOSIS — B029 Zoster without complications: Secondary | ICD-10-CM

## 2021-11-09 MED ORDER — VALACYCLOVIR HCL 500 MG PO TABS: 500.0000 mg | ORAL_TABLET | ORAL | 4 refills | Status: DC

## 2021-11-11 NOTE — Telephone Encounter (Signed)
The dose patient requested is excessive.  The maintenance dose is 400 mg daily.  I have refilled it for twice daily , which insurance will cover,  so sh will have extra if she has a breakout she takes it 3 times daily

## 2021-11-11 NOTE — Telephone Encounter (Signed)
Pharmacy is requesting an alternative medication because the insurance will only pay for 2 daily.

## 2021-11-22 ENCOUNTER — Encounter: Payer: Self-pay | Admitting: Internal Medicine

## 2021-11-22 DIAGNOSIS — B029 Zoster without complications: Secondary | ICD-10-CM

## 2021-11-29 ENCOUNTER — Ambulatory Visit (INDEPENDENT_AMBULATORY_CARE_PROVIDER_SITE_OTHER): Payer: Medicare Other | Admitting: Adult Health

## 2021-11-29 ENCOUNTER — Encounter: Payer: Self-pay | Admitting: Adult Health

## 2021-11-29 VITALS — BP 126/76 | HR 71 | Temp 97.8°F | Ht <= 58 in | Wt 118.0 lb

## 2021-11-29 DIAGNOSIS — R0683 Snoring: Secondary | ICD-10-CM

## 2021-11-29 DIAGNOSIS — G4733 Obstructive sleep apnea (adult) (pediatric): Secondary | ICD-10-CM | POA: Insufficient documentation

## 2021-11-29 DIAGNOSIS — G4719 Other hypersomnia: Secondary | ICD-10-CM | POA: Diagnosis not present

## 2021-11-29 DIAGNOSIS — G4700 Insomnia, unspecified: Secondary | ICD-10-CM | POA: Diagnosis not present

## 2021-11-29 NOTE — Assessment & Plan Note (Signed)
Snoring, daytime sleepiness, restless sleep all concerning for underlying sleep apnea.  Set patient up for a home sleep study.  Patient education was given  .- discussed how weight can impact sleep and risk for sleep disordered breathing - discussed options to assist with weight loss: combination of diet modification, cardiovascular and strength training exercises   - had an extensive discussion regarding the adverse health consequences related to untreated sleep disordered breathing - specifically discussed the risks for hypertension, coronary artery disease, cardiac dysrhythmias, cerebrovascular disease, and diabetes - lifestyle modification discussed   - discussed how sleep disruption can increase risk of accidents, particularly when driving - safe driving practices were discussed   Plan  Patient Instructions  Set up for home sleep study Healthy sleep regimen Do not drive if sleepy Follow up in 2 months to discuss sleep study results and treatment plan

## 2021-11-29 NOTE — Assessment & Plan Note (Signed)
Insomnia and restless sleep  Healthy sleep regimen discussed  Trazodone seems to be helping  Sleep study pending .  Caution with sedating meds

## 2021-11-29 NOTE — Patient Instructions (Signed)
Set up for home sleep study Healthy sleep regimen Do not drive if sleepy Follow up in 2 months to discuss sleep study results and treatment plan

## 2021-11-29 NOTE — Progress Notes (Signed)
$'@Patient'q$  ID: Courtney Jensen, female    DOB: [birth year], 76 y.o.   MRN: [id number]  Chief Complaint  Patient presents with   sleep consult    Referring provider: Crecencio Mc, MD  HPI: 76 year old female seen for sleep consult November 29, 2021 for restless sleep, snoring and daytime sleepiness.  TEST/EVENTS :   11/29/2021 Sleep consult Patient presents for sleep consult today for restless sleep, snoring and daytime sleepiness.  Kindly referred by primary care provider Dr. Derrel Nip.  Patient complains that she has very restless sleep, reported snoring by her spouse and daytime sleepiness.  Says she wakes up frequently throughout the night.Has recently been started on trazodone at bedtime which seems to be helping with her sleep some.  Typically goes to bed between 9:30 to 12 Midnight , takes 90mn to go to sleep , up 2 times each night . Gets up at 7 am. No previous sleep study . No symptoms suspicious for cataplexy or sleep paralysis. [place] is 4 out of 24.  Typically gets sleepy watching TV in the afternoon hours. No history of congestive heart failure or stroke. Gets sleepy after lunch. No naps.  Wakes up frequently through out the night , hard to go back to sleep . Trazodone has helped sleep thru the night.   Social history patient is married lives with her husband at home.  Has adult children.  Quit smoking over 40 years ago.  Drinks 1 alcoholic drink nightly.  No drug use. Active. Walks daily   Family history positive for heart disease  Past Surgical History:  Procedure Laterality Date   ABDOMINAL HYSTERECTOMY     BUNIONECTOMY Bilateral    CARPAL TUNNEL RELEASE Bilateral    KNEE ARTHROSCOPY Left    SHOULDER ARTHROSCOPY WITH OPEN ROTATOR CUFF REPAIR Left 12/02/2014   Procedure: left shoulder arthroscopy, arthroscopic debridement, subacromial decompression, mini open rotator cuff repair;  Surgeon: JCorky Mull MD;  Location: [facility] ORS;  Service: Orthopedics;  Laterality: Left;       Allergies  Allergen Reactions   Codeine Nausea And Vomiting   Corticosteroids Other (See Comments)    Personality change "I become very mean."   Levothyroxine Hives   Oxycodone-Acetaminophen Other (See Comments)   Doxycycline Rash    Immunization History  Administered Date(s) Administered   Influenza Whole 10/29/2007, 12/17/2008   Influenza, High Dose Seasonal PF 10/27/2014, 11/16/2020   Influenza,inj,Quad PF,6+ Mos 10/29/2012, 11/30/2015   Influenza-Unspecified 10/31/2013   Moderna Sars-Covid-2 Vaccination 04/03/2019, 05/01/2019   Pneumococcal Conjugate-13 10/27/2014   Pneumococcal Polysaccharide-23 10/27/2010   Td 12/17/2008   Tdap 10/29/2012   Zoster, Live 10/27/2006    Past Medical History:  Diagnosis Date   Anxiety    Depression    Eczema    Hypothyroidism    Osteoporosis    Recurrent herpes simplex    Thyroid disease     Tobacco History: Social History   Tobacco Use  Smoking Status Former   Packs/day: 0.50   Years: 10.00   Total pack years: 5.00   Types: Cigarettes   Quit date: 04/13/1984   Years since quitting: 37.6  Smokeless Tobacco Not on file   Counseling given: Not Answered   Outpatient Medications Prior to Visit  Medication Sig Dispense Refill   acyclovir (ZOVIRAX) 400 MG tablet Take 1 tablet (400 mg total) by mouth 2 (two) times daily. 60 tablet 2   atorvastatin (LIPITOR) 10 MG tablet TAKE 1 TABLET BY MOUTH EVERY DAY 90 tablet 0  thyroid (ARMOUR THYROID) 60 MG tablet TAKE ONE TABLET BY MOUTH EVERY DAY IN THE MORNING, TAKE TWO TABLETS ON FRIDAY 108 tablet 0   traZODone (DESYREL) 100 MG tablet      doxycycline (VIBRA-TABS) 100 MG tablet Take 1 tablet (100 mg total) by mouth 2 (two) times daily. 20 tablet 0   No facility-administered medications prior to visit.     Review of Systems:   Constitutional:   No  weight loss, night sweats,  Fevers, chills,  +fatigue, or  lassitude.  HEENT:   No headaches,  Difficulty swallowing,   Tooth/dental problems, or  Sore throat,                No sneezing, itching, ear ache, nasal congestion, post nasal drip,   CV:  No chest pain,  Orthopnea, PND, swelling in lower extremities, anasarca, dizziness, palpitations, syncope.   GI  No heartburn, indigestion, abdominal pain, nausea, vomiting, diarrhea, change in bowel habits, loss of appetite, bloody stools.   Resp: No shortness of breath with exertion or at rest.  No excess mucus, no productive cough,  No non-productive cough,  No coughing up of blood.  No change in color of mucus.  No wheezing.  No chest wall deformity  Skin: no rash or lesions.  GU: no dysuria, change in color of urine, no urgency or frequency.  No flank pain, no hematuria   MS:  No joint pain or swelling.  No decreased range of motion.  No back pain.    Physical Exam  BP 126/76 (BP Location: Left Arm, Cuff Size: Normal)   Pulse 71   Temp 97.8 F (36.6 C) (Temporal)   Ht 4' 9.75" (1.467 m)   Wt 118 lb (53.5 kg)   SpO2 95%   BMI 24.88 kg/m   GEN: A/Ox3; pleasant , NAD, well nourished    HEENT:  New /AT,  TMs-wnl, NOSE-clear, THROAT-clear, no lesions, no postnasal drip or exudate noted. Class 2-3 MP airway   NECK:  Supple w/ fair ROM; no JVD; normal carotid impulses w/o bruits; no thyromegaly or nodules palpated; no lymphadenopathy.    RESP  Clear  P & A; w/o, wheezes/ rales/ or rhonchi. no accessory muscle use, no dullness to percussion  CARD:  RRR, no m/r/g, no peripheral edema, pulses intact, no cyanosis or clubbing.  GI:   Soft & nt; nml bowel sounds; no organomegaly or masses detected.   Musco: Warm bil, no deformities or joint swelling noted.   Neuro: alert, no focal deficits noted.    Skin: Warm, no lesions or rashes    Lab Results:  CBC    Component Value Date/Time   WBC 4.9 07/16/2021 1136   RBC 4.15 07/16/2021 1136   HGB 13.4 07/16/2021 1136   HCT 41.1 07/16/2021 1136   PLT 166.0 07/16/2021 1136   MCV 98.9 07/16/2021 1136    MCHC 32.7 07/16/2021 1136   RDW 12.6 07/16/2021 1136   LYMPHSABS 2.4 07/16/2021 1136   MONOABS 0.4 07/16/2021 1136   EOSABS 0.1 07/16/2021 1136   BASOSABS 0.0 07/16/2021 1136    BMET    Component Value Date/Time   NA 142 08/13/2021 1417   K 3.7 08/13/2021 1417   CL 108 08/13/2021 1417   CO2 28 08/13/2021 1417   GLUCOSE 101 (H) 08/13/2021 1417   BUN 13 08/13/2021 1417   CREATININE 0.86 08/13/2021 1417   CALCIUM 9.6 08/13/2021 1417   GFRNONAA 81 01/20/2014 1613   GFRAA >89 01/20/2014 1613  BNP No results found for: "BNP"  ProBNP No results found for: "PROBNP"  Imaging: No results found.        No data to display          No results found for: "NITRICOXIDE"      Assessment & Plan:   Snoring Snoring, daytime sleepiness, restless sleep all concerning for underlying sleep apnea.  Set patient up for a home sleep study.  Patient education was given  .- discussed how weight can impact sleep and risk for sleep disordered breathing - discussed options to assist with weight loss: combination of diet modification, cardiovascular and strength training exercises   - had an extensive discussion regarding the adverse health consequences related to untreated sleep disordered breathing - specifically discussed the risks for hypertension, coronary artery disease, cardiac dysrhythmias, cerebrovascular disease, and diabetes - lifestyle modification discussed   - discussed how sleep disruption can increase risk of accidents, particularly when driving - safe driving practices were discussed   Plan  Patient Instructions  Set up for home sleep study Healthy sleep regimen Do not drive if sleepy Follow up in 2 months to discuss sleep study results and treatment plan     Insomnia Insomnia and restless sleep  Healthy sleep regimen discussed  Trazodone seems to be helping  Sleep study pending .  Caution with sedating meds      Rexene Edison, NP 11/29/2021

## 2021-11-30 NOTE — Progress Notes (Signed)
Reviewed and agree with assessment/plan.   Chesley Mires, MD [facility] Pulmonary/Critical Care 11/30/2021, 10:36 AM Pager:  [phone]

## 2022-01-03 ENCOUNTER — Other Ambulatory Visit: Payer: Self-pay | Admitting: Family

## 2022-01-10 ENCOUNTER — Ambulatory Visit: Payer: Medicare Other

## 2022-01-10 DIAGNOSIS — G4719 Other hypersomnia: Secondary | ICD-10-CM

## 2022-01-10 DIAGNOSIS — G4733 Obstructive sleep apnea (adult) (pediatric): Secondary | ICD-10-CM | POA: Diagnosis not present

## 2022-01-13 ENCOUNTER — Encounter: Payer: Self-pay | Admitting: Internal Medicine

## 2022-01-13 MED ORDER — ATORVASTATIN CALCIUM 10 MG PO TABS: 10.0000 mg | ORAL_TABLET | Freq: Every day | ORAL | 1 refills | Status: DC

## 2022-01-14 DIAGNOSIS — G4733 Obstructive sleep apnea (adult) (pediatric): Secondary | ICD-10-CM | POA: Diagnosis not present

## 2022-01-18 ENCOUNTER — Other Ambulatory Visit: Payer: Self-pay | Admitting: Internal Medicine

## 2022-01-18 ENCOUNTER — Encounter: Payer: Self-pay | Admitting: Internal Medicine

## 2022-01-18 DIAGNOSIS — M21611 Bunion of right foot: Secondary | ICD-10-CM

## 2022-01-18 DIAGNOSIS — M2041 Other hammer toe(s) (acquired), right foot: Secondary | ICD-10-CM

## 2022-01-20 ENCOUNTER — Telehealth: Payer: Self-pay | Admitting: Adult Health

## 2022-01-20 ENCOUNTER — Ambulatory Visit: Payer: Medicare Other | Admitting: Adult Health

## 2022-01-20 NOTE — Telephone Encounter (Signed)
That is fine if she does not want to come in . Will copy Dr. Patsey Berthold

## 2022-01-20 NOTE — Telephone Encounter (Signed)
It appears that patient was scheduled incorrectly with Dr. Patsey Berthold. Patient seen for sleep.  Contacted patient and scheduled appt 02/08/22 at 3:30. She voiced her understanding.  Nothing further needed.

## 2022-01-20 NOTE — Telephone Encounter (Signed)
Patient is aware of results and voiced her understanding.  She is scheduled to see Dr. Patsey Berthold 03/04/2022. She is questioning if she should be seen sooner to discuss results? She declined virtual visit.   Tammy, please advise. Thanks

## 2022-01-20 NOTE — Telephone Encounter (Signed)
Home sleep study done on January 10, 2022 showed mild sleep apnea with AHI at 9.9/hour and SpO2 low at 82%. Please set up an office visit or virtual visit to discuss sleep study results and treatment plan

## 2022-02-08 ENCOUNTER — Ambulatory Visit: Payer: Medicare Other | Admitting: Adult Health

## 2022-02-14 ENCOUNTER — Encounter: Payer: Self-pay | Admitting: Internal Medicine

## 2022-02-14 ENCOUNTER — Ambulatory Visit (INDEPENDENT_AMBULATORY_CARE_PROVIDER_SITE_OTHER): Payer: PPO | Admitting: Internal Medicine

## 2022-02-14 VITALS — BP 122/62 | HR 65 | Temp 98.2°F | Ht <= 58 in | Wt 122.8 lb

## 2022-02-14 DIAGNOSIS — G3184 Mild cognitive impairment, so stated: Secondary | ICD-10-CM

## 2022-02-14 DIAGNOSIS — K52831 Collagenous colitis: Secondary | ICD-10-CM

## 2022-02-14 DIAGNOSIS — Z23 Encounter for immunization: Secondary | ICD-10-CM | POA: Diagnosis not present

## 2022-02-14 DIAGNOSIS — I679 Cerebrovascular disease, unspecified: Secondary | ICD-10-CM | POA: Diagnosis not present

## 2022-02-14 DIAGNOSIS — G4733 Obstructive sleep apnea (adult) (pediatric): Secondary | ICD-10-CM | POA: Diagnosis not present

## 2022-02-14 DIAGNOSIS — R471 Dysarthria and anarthria: Secondary | ICD-10-CM | POA: Diagnosis not present

## 2022-02-14 DIAGNOSIS — E782 Mixed hyperlipidemia: Secondary | ICD-10-CM

## 2022-02-14 DIAGNOSIS — I700 Atherosclerosis of aorta: Secondary | ICD-10-CM | POA: Diagnosis not present

## 2022-02-14 DIAGNOSIS — E034 Atrophy of thyroid (acquired): Secondary | ICD-10-CM | POA: Diagnosis not present

## 2022-02-14 DIAGNOSIS — R944 Abnormal results of kidney function studies: Secondary | ICD-10-CM | POA: Diagnosis not present

## 2022-02-14 MED ORDER — ASPIRIN 81 MG PO TBEC: 81.0000 mg | DELAYED_RELEASE_TABLET | Freq: Every day | ORAL | 1 refills | Status: AC

## 2022-02-14 NOTE — Assessment & Plan Note (Signed)
Mild,  by home sleep study done in Dec 2023.  AHI was 9.9.  desats to 82% noted.  Awaiting pulmonology appt for management

## 2022-02-14 NOTE — Assessment & Plan Note (Addendum)
Excellent HDL:LDl ratio.   she is tolerating atorvastatin for documented PAD   Lab Results  Component Value Date   CHOL 155 02/14/2022   HDL 81.70 02/14/2022   LDLCALC 60 02/14/2022   LDLDIRECT 49.0 02/14/2022   TRIG 66.0 02/14/2022   CHOLHDL 2 02/14/2022

## 2022-02-14 NOTE — Patient Instructions (Addendum)
Your annual mammogram is overdue and was ordered at your last visit .  Please  call to make your appointment at [place] may work just as well as Engineer, production on the bloating and gas .  Try using beano before any meal with any beans , cabbage,  Broccoli, cauliflower and brussel sprouts   You received the prevnar 20 vaccine today to complete your vaccinations for pneumonia

## 2022-02-14 NOTE — Progress Notes (Addendum)
Subjective:  Patient ID: Courtney Jensen, female    DOB: 04/17/1945  Age: 77 y.o. MRN: [id number]  CC: The primary encounter diagnosis was Mixed hyperlipidemia. Diagnoses of Hypothyroidism due to acquired atrophy of thyroid, Collagenous colitis, OSA (obstructive sleep apnea), MCI (mild cognitive impairment) with memory loss, Aortic atherosclerosis ([place]), Thoracic aortic atherosclerosis ([place]), Need for pneumococcal 20-valent conjugate vaccination, Cerebrovascular small vessel disease, Dysarthria, and Decreased GFR were also pertinent to this visit.   HPI Courtney Jensen presents for  Chief Complaint  Patient presents with   Medical Management of Chronic Issues    6 month follow up    Courtney Jensen presents without Courtney Jensen today..  She states that over the holidays, multiple family member became sick  with RSV,  COVID and flu.  Fortunately she  did not get sick   1) aortic atherosclerosis (thoracic) :  we Reviewed findings of prior CT scan today..  Patient is tolerating high potency statin therapy  . Recommending adding baby aspirin daily given evidence of small vessel cerebrovascular disease on MRI  BRAIN done July 2023 due to new onset dysarthria.   2) OSA : she was referred to pulmonology for mild OSA AHA 9,9 with  desaturations , lowest 02 sat was  82%   by home sleep study Dec 2023.  Appointment WAS POSTPONED due to illness and rescheduled for Feb  2024   3) MCI: referred to Neurology Courtney Jensen in July.  MRI brain and screening for AD was done : APO E testing was done in Dec 2023  for Alzheimers Disease with low A beta 42/40   4) chronic diarrhea : her symptoms have  resolved:  she had colonoscopy Feb 2023 : collagenous colitis by biopsy, , and TA was removed .  She now reports recurrent bloating and gas managed with pepto bismol   5) walks every day for exercise and "stay busy"    Outpatient Medications Prior to Visit  Medication Sig Dispense Refill   acyclovir (ZOVIRAX) 400 MG tablet Take 1  tablet (400 mg total) by mouth 2 (two) times daily. 60 tablet 2   atorvastatin (LIPITOR) 10 MG tablet Take 1 tablet (10 mg total) by mouth daily. 90 tablet 1   thyroid (ARMOUR THYROID) 60 MG tablet TAKE ONE TABLET BY MOUTH EVERY DAY IN THE MORNING, TAKE TWO TABLETS ON FRIDAY 108 tablet 1   traZODone (DESYREL) 100 MG tablet      No facility-administered medications prior to visit.    Review of Systems;  Patient denies headache, fevers, malaise, unintentional weight loss, skin rash, eye pain, sinus congestion and sinus pain, sore throat, dysphagia,  hemoptysis , cough, dyspnea, wheezing, chest pain, palpitations, orthopnea, edema, abdominal pain, nausea, melena, diarrhea, constipation, flank pain, dysuria, hematuria, urinary  Frequency, nocturia, numbness, tingling, seizures,  Focal weakness, Loss of consciousness,  Tremor, insomnia, depression, anxiety, and suicidal ideation.      Objective:  BP 122/62   Pulse 65   Temp 98.2 F (36.8 C) (Oral)   Ht 4' 9.75" (1.467 m)   Wt 122 lb 12.8 oz (55.7 kg)   SpO2 96%   BMI 25.89 kg/m   BP Readings from Last 3 Encounters:  02/14/22 122/62  11/29/21 126/76  08/13/21 124/72    Wt Readings from Last 3 Encounters:  02/14/22 122 lb 12.8 oz (55.7 kg)  11/29/21 118 lb (53.5 kg)  10/01/21 120 lb (54.4 kg)    Physical Exam Vitals reviewed.  Constitutional:      General: She  is not in acute distress.    Appearance: Normal appearance. She is normal weight. She is not ill-appearing, toxic-appearing or diaphoretic.  HENT:     Head: Normocephalic.  Eyes:     General: No scleral icterus.       Right eye: No discharge.        Left eye: No discharge.     Conjunctiva/sclera: Conjunctivae normal.  Cardiovascular:     Rate and Rhythm: Normal rate and regular rhythm.     Heart sounds: Normal heart sounds.  Pulmonary:     Effort: Pulmonary effort is normal. No respiratory distress.     Breath sounds: Normal breath sounds.  Musculoskeletal:         General: Normal range of motion.  Skin:    General: Skin is warm and dry.  Neurological:     General: No focal deficit present.     Mental Status: She is alert and oriented to person, place, and time. Mental status is at baseline.     Motor: No weakness.     Gait: Gait normal.  Psychiatric:        Mood and Affect: Mood normal.        Behavior: Behavior normal.        Thought Content: Thought content normal.        Judgment: Judgment normal.     Lab Results  Component Value Date   HGBA1C 5.6 07/16/2021    Lab Results  Component Value Date   CREATININE 0.98 02/14/2022   CREATININE 0.86 08/13/2021   CREATININE 0.76 08/06/2021    Lab Results  Component Value Date   WBC 4.9 07/16/2021   HGB 13.4 07/16/2021   HCT 41.1 07/16/2021   PLT 166.0 07/16/2021   GLUCOSE 101 (H) 02/14/2022   CHOL 155 02/14/2022   TRIG 66.0 02/14/2022   HDL 81.70 02/14/2022   LDLDIRECT 49.0 02/14/2022   LDLCALC 60 02/14/2022   ALT 27 02/14/2022   AST 28 02/14/2022   NA 141 02/14/2022   K 3.6 02/14/2022   CL 103 02/14/2022   CREATININE 0.98 02/14/2022   BUN 13 02/14/2022   CO2 30 02/14/2022   TSH 3.95 02/14/2022   HGBA1C 5.6 07/16/2021    MM SCREENING BREAST TOMO BILATERAL  Result Date: 09/26/2016 CLINICAL DATA:  Screening. EXAM: 2D DIGITAL SCREENING BILATERAL MAMMOGRAM WITH CAD AND ADJUNCT TOMO COMPARISON:  Previous exam(s). ACR Breast Density Category c: The breast tissue is heterogeneously dense, which may obscure small masses. FINDINGS: There are no findings suspicious for malignancy. Images were processed with CAD. IMPRESSION: No mammographic evidence of malignancy. A result letter of this screening mammogram will be mailed directly to the patient. RECOMMENDATION: Screening mammogram in one year. (Code:[id number]-B-01Y) BI-RADS CATEGORY  1: Negative. Electronically Signed   By: Lajean Manes M.D.   On: 09/26/2016 16:09    Assessment & Plan:  .Mixed hyperlipidemia Assessment & Plan: Excellent  HDL:LDl ratio.   she is tolerating atorvastatin for documented PAD   Lab Results  Component Value Date   CHOL 155 02/14/2022   HDL 81.70 02/14/2022   LDLCALC 60 02/14/2022   LDLDIRECT 49.0 02/14/2022   TRIG 66.0 02/14/2022   CHOLHDL 2 02/14/2022      Orders: -     Lipid panel -     LDL cholesterol, direct -     Comprehensive metabolic panel  Hypothyroidism due to acquired atrophy of thyroid Assessment & Plan: Thyroid function is WNL on current dose of armour thyroid  No current changes needed.    Lab Results  Component Value Date   TSH 3.95 02/14/2022     Orders: -     TSH  Collagenous colitis Assessment & Plan: Noted on 2023 colonoscopy done to evaluate chronic diarrhea .  Diarrhea has resolved, but now with bloating and gas managed with pepto bismol   OSA (obstructive sleep apnea) Assessment & Plan: Mild,  by home sleep study done in Dec 2023.  AHI was 9.9.  desats to 82% noted.  Awaiting pulmonology appt for management   MCI (mild cognitive impairment) with memory loss Assessment & Plan: She was evaluated by her previous PCP in 2022 with MOCA (score 26/30 and brain MRI.  Current MRI report compared with report for 2022 study: there are no significant changes and no evidence of a prior stroke.,  Only small vessel ischemic changes.  She has untreated OSA by Dec 2023 study and is awaiting pulmonology appt to manage.  APE E 4 testing has been done by Dr Courtney Jensen and follow up is scheduled   Aortic atherosclerosis [facility])  Thoracic aortic atherosclerosis [facility]) Assessment & Plan: Reviewed findings of prior CT scan today..  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  Speech  is intelligible, just slowed,  and has not progressed.  Follow up with Dr Courtney Jensen to review results of recent testing and to discuss options of therapy   Decreased GFR Assessment & Plan: Return in 2 weeks after suspending any nsaids and increasing her water intake  Lab Results  Component Value Date   CREATININE 0.98 02/14/2022     Orders: -     Basic metabolic panel; Future  Other orders -     Aspirin; Take 1 tablet (81 mg total) by mouth daily. Swallow whole.  Dispense: 90 tablet; Refill: 1     I provided 30 minutes of face-to-face time during this encounter reviewing patient's last visit with me, patient's  most recent visit with and neurology,  recent surgical and non surgical procedures, previous  labs and imaging studies, counseling on currently addressed issues,  and post visit ordering to diagnostics and therapeutics .   Follow-up: No follow-ups on file.   Crecencio Mc, MD

## 2022-02-14 NOTE — Assessment & Plan Note (Signed)
She was evaluated by her previous PCP in 2022 with MOCA (score 26/30 and brain MRI.  Current MRI report compared with report for 2022 study: there are no significant changes and no evidence of a prior stroke.,  Only small vessel ischemic changes.  She has untreated OSA by Dec 2023 study and is awaiting pulmonology appt to manage.  APE E 4 testing has been done by Dr Manuella Ghazi and follow up is scheduled

## 2022-02-14 NOTE — Assessment & Plan Note (Signed)
Noted on 2023 colonoscopy done to evaluate chronic diarrhea .  Diarrhea has resolved, but now with bloating and gas managed with pepto bismol

## 2022-02-14 NOTE — Assessment & Plan Note (Signed)
Noted on MRI  brain done during workup for dysarthria  continue statin,  advised to add 81 mg aspirin

## 2022-02-14 NOTE — Assessment & Plan Note (Signed)
Reviewed findings of prior CT scan today..  Patient is tolerating high potency statin therapy  

## 2022-02-15 LAB — COMPREHENSIVE METABOLIC PANEL
ALT: 27 U/L (ref 0–35)
AST: 28 U/L (ref 0–37)
Albumin: 4.2 g/dL (ref 3.5–5.2)
Alkaline Phosphatase: 62 U/L (ref 39–117)
BUN: 13 mg/dL (ref 6–23)
CO2: 30 mEq/L (ref 19–32)
Calcium: 9.2 mg/dL (ref 8.4–10.5)
Chloride: 103 mEq/L (ref 96–112)
Creatinine, Ser: 0.98 mg/dL (ref 0.40–1.20)
GFR: 55.93 mL/min — ABNORMAL LOW (ref >60.00)
Glucose, Bld: 101 mg/dL — ABNORMAL HIGH (ref 70–99)
Potassium: 3.6 mEq/L (ref 3.5–5.1)
Sodium: 141 mEq/L (ref 135–145)
Total Bilirubin: 0.5 mg/dL (ref 0.2–1.2)
Total Protein: 6.3 g/dL (ref 6.0–8.3)

## 2022-02-15 LAB — LIPID PANEL
Cholesterol: 155 mg/dL (ref 0–200)
HDL: 81.7 mg/dL (ref >39.00)
LDL Cholesterol: 60 mg/dL (ref 0–99)
NonHDL: 73.02
Total CHOL/HDL Ratio: 2
Triglycerides: 66 mg/dL (ref 0.0–149.0)
VLDL: 13.2 mg/dL (ref 0.0–40.0)

## 2022-02-15 LAB — TSH: TSH: 3.95 u[IU]/mL (ref 0.35–5.50)

## 2022-02-15 LAB — LDL CHOLESTEROL, DIRECT: Direct LDL: 49 mg/dL

## 2022-02-15 NOTE — Assessment & Plan Note (Signed)
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  Speech  is intelligible, just slowed,  and has not progressed.  Follow up with Dr Manuella Ghazi to review results of recent testing and to discuss options of therapy

## 2022-02-16 DIAGNOSIS — F5104 Psychophysiologic insomnia: Secondary | ICD-10-CM | POA: Diagnosis not present

## 2022-02-16 DIAGNOSIS — G3184 Mild cognitive impairment, so stated: Secondary | ICD-10-CM | POA: Diagnosis not present

## 2022-02-16 DIAGNOSIS — G939 Disorder of brain, unspecified: Secondary | ICD-10-CM | POA: Diagnosis not present

## 2022-02-17 DIAGNOSIS — R944 Abnormal results of kidney function studies: Secondary | ICD-10-CM | POA: Insufficient documentation

## 2022-02-17 NOTE — Addendum Note (Signed)
Addended by: Crecencio Mc on: 02/17/2022 03:18 PM   Modules accepted: Orders

## 2022-02-17 NOTE — Assessment & Plan Note (Signed)
Thyroid function is WNL on current dose of armour thyroid  No current changes needed.    Lab Results  Component Value Date   TSH 3.95 02/14/2022

## 2022-02-17 NOTE — Assessment & Plan Note (Signed)
Return in 2 weeks after suspending any nsaids and increasing her water intake  Lab Results  Component Value Date   CREATININE 0.98 02/14/2022

## 2022-02-18 ENCOUNTER — Telehealth: Payer: Self-pay

## 2022-02-18 NOTE — Telephone Encounter (Signed)
-----  Message from Crecencio Mc, MD sent at 02/17/2022  3:15 PM EST ----- Your kidney function is off a little. Since you were fasting ,  It may be from mild dehydration.  However, If you have been taking any nonsteroidals (ibuprofen, Aleve,  Advil, Motrin, or meloxicam),  Please suspend them , increase your hydration,  And  return in 2 weeks  for repeat blood test  to make sure the kidney function is back to normal. You willl need to make a lab appt.   Regards,  Dr. Derrel Nip

## 2022-02-18 NOTE — Telephone Encounter (Signed)
LMTCB. Need to scheduled pt for a lab appt in two weeks.

## 2022-02-21 ENCOUNTER — Ambulatory Visit (INDEPENDENT_AMBULATORY_CARE_PROVIDER_SITE_OTHER): Payer: PPO

## 2022-02-21 ENCOUNTER — Ambulatory Visit: Payer: PPO | Admitting: Podiatry

## 2022-02-21 DIAGNOSIS — M7742 Metatarsalgia, left foot: Secondary | ICD-10-CM

## 2022-02-21 DIAGNOSIS — M21612 Bunion of left foot: Secondary | ICD-10-CM

## 2022-02-21 DIAGNOSIS — M2042 Other hammer toe(s) (acquired), left foot: Secondary | ICD-10-CM

## 2022-02-21 DIAGNOSIS — M21611 Bunion of right foot: Secondary | ICD-10-CM

## 2022-02-21 DIAGNOSIS — M2041 Other hammer toe(s) (acquired), right foot: Secondary | ICD-10-CM | POA: Diagnosis not present

## 2022-02-21 DIAGNOSIS — M7741 Metatarsalgia, right foot: Secondary | ICD-10-CM | POA: Diagnosis not present

## 2022-02-21 NOTE — Patient Instructions (Addendum)
The surgery we discussed is called: fusion of the big toe joint and a removal of the bones behind the toes (metatarsal head resection). This is often known as a Hoffman-Clayton procedure and was often done with patients for rheumatoid arthritis  This is an outpatient surgery, you would be in a boot after surgery and no weight on the foot for 2-3 weeks, then walking in the boot for an additional 6 weeks. The pins are removed from the toes at 6 weeks in the office

## 2022-02-23 NOTE — Progress Notes (Signed)
  Subjective:  Patient ID: Courtney Jensen, female    DOB: Nov 07, 1945,  MRN: [id number]  Chief Complaint  Patient presents with   Bunions   Hammer Toe    77 y.o. female presents with the above complaint. History confirmed with patient.  These been gradually getting worse over the last several years.  Becoming painful.  Objective:  Physical Exam: warm, good capillary refill, no trophic changes or ulcerative lesions, normal DP and PT pulses, normal sensory exam, and severe hallux valgus deformity, track bound, painful hyperkeratosis palpable tender met heads 2 and 3, contracted hammertoes, minimal congruency of second and third MPJ.   Radiographs: Multiple views x-ray of both feet: Severe hallux valgus deformity with arthritic changes of the first MTPJ, dislocation of second and third MTPJ Assessment:   1. Bilateral bunions   2. Hammertoes of both feet   3. Metatarsalgia of both feet      Plan:  Patient was evaluated and treated and all questions answered.  We reviewed her x-rays together discussed surgical and nonsurgical treatment options.  Surgically she would require a pan metatarsal head resection as well as first MTPJ arthrodesis.  We discussed the procedure, recovery process and periods of nonweightbearing.  She would like to avoid surgery at this point.  We discussed the alternative would be to offload the painful metatarsal heads with a custom molded accommodative orthosis.  She was casted for this today.  Return in about 3 months (around 05/23/2022) for follow up on orthotics and bunions/toes.

## 2022-03-01 ENCOUNTER — Ambulatory Visit: Payer: Medicare Other | Admitting: Pulmonary Disease

## 2022-03-02 ENCOUNTER — Encounter: Payer: Self-pay | Admitting: Internal Medicine

## 2022-03-03 MED ORDER — VALACYCLOVIR HCL 500 MG PO TABS: ORAL_TABLET | ORAL | 11 refills | Status: DC

## 2022-03-03 NOTE — Telephone Encounter (Signed)
Is it okay to resend as [facility]?

## 2022-03-04 ENCOUNTER — Ambulatory Visit: Payer: Medicare Other | Admitting: Pulmonary Disease

## 2022-03-07 ENCOUNTER — Other Ambulatory Visit (INDEPENDENT_AMBULATORY_CARE_PROVIDER_SITE_OTHER): Payer: PPO

## 2022-03-07 DIAGNOSIS — R944 Abnormal results of kidney function studies: Secondary | ICD-10-CM | POA: Diagnosis not present

## 2022-03-07 LAB — BASIC METABOLIC PANEL
BUN: 11 mg/dL (ref 6–23)
CO2: 28 mEq/L (ref 19–32)
Calcium: 9.1 mg/dL (ref 8.4–10.5)
Chloride: 104 mEq/L (ref 96–112)
Creatinine, Ser: 0.79 mg/dL (ref 0.40–1.20)
GFR: 72.41 mL/min (ref >60.00)
Glucose, Bld: 93 mg/dL (ref 70–99)
Potassium: 4 mEq/L (ref 3.5–5.1)
Sodium: 140 mEq/L (ref 135–145)

## 2022-03-09 ENCOUNTER — Other Ambulatory Visit: Payer: Self-pay | Admitting: Internal Medicine

## 2022-03-09 DIAGNOSIS — B029 Zoster without complications: Secondary | ICD-10-CM

## 2022-03-21 ENCOUNTER — Ambulatory Visit
Admission: RE | Admit: 2022-03-21 | Discharge: 2022-03-21 | Disposition: A | Discharge status: Home / Self Care | Payer: PPO | Source: Ambulatory Visit | Attending: Internal Medicine | Admitting: Internal Medicine

## 2022-03-21 DIAGNOSIS — Z1231 Encounter for screening mammogram for malignant neoplasm of breast: Secondary | ICD-10-CM | POA: Diagnosis not present

## 2022-03-23 ENCOUNTER — Ambulatory Visit: Payer: PPO | Attending: Neurology | Admitting: Speech Pathology

## 2022-03-23 DIAGNOSIS — G3184 Mild cognitive impairment, so stated: Secondary | ICD-10-CM

## 2022-03-23 DIAGNOSIS — R41841 Cognitive communication deficit: Secondary | ICD-10-CM | POA: Diagnosis not present

## 2022-03-23 DIAGNOSIS — R4789 Other speech disturbances: Secondary | ICD-10-CM | POA: Insufficient documentation

## 2022-03-23 NOTE — Therapy (Unsigned)
OUTPATIENT SPEECH LANGUAGE PATHOLOGY  COGNITION EVALUATION   Patient Name: Courtney Jensen MRN: [id number] DOB:31-Jan-1946, 77 y.o., female Today's Date: 03/23/2022  PCP: Crecencio Mc, MD  REFERRING PROVIDER: Vladimir Crofts, MD    End of Session - 03/23/22 1512     Visit Number 1    Number of Visits 17    Date for SLP Re-Evaluation 05/18/22    Authorization Type [employer]    Progress Note Due on Visit 10    SLP Start Time 1515    SLP Stop Time  1600    SLP Time Calculation (min) 45 min    Activity Tolerance Patient tolerated treatment well             Past Medical History:  Diagnosis Date   Anxiety    Depression    Eczema    Hypothyroidism    Osteoporosis    Recurrent herpes simplex    Thyroid disease    Past Surgical History:  Procedure Laterality Date   ABDOMINAL HYSTERECTOMY     BUNIONECTOMY Bilateral    CARPAL TUNNEL RELEASE Bilateral    KNEE ARTHROSCOPY Left    SHOULDER ARTHROSCOPY WITH OPEN ROTATOR CUFF REPAIR Left 12/02/2014   Procedure: left shoulder arthroscopy, arthroscopic debridement, subacromial decompression, mini open rotator cuff repair;  Surgeon: Corky Mull, MD;  Location: [facility] ORS;  Service: Orthopedics;  Laterality: Left;   Patient Active Problem List   Diagnosis Date Noted   Decreased GFR 02/17/2022   Cerebrovascular small vessel disease 02/14/2022   OSA (obstructive sleep apnea) 11/29/2021   Insomnia 11/29/2021   MCI (mild cognitive impairment) with memory loss 08/15/2021   History of Clostridium difficile colitis 08/15/2021   History of Lyme disease 08/15/2021   Hammertoe, bilateral 07/18/2021   Dysarthria 07/18/2021   Collagenous colitis 07/16/2021   Thoracic aortic atherosclerosis ([place]) 10/04/2015   Dizziness and giddiness 04/28/2015   Encounter for Medicare annual wellness exam 10/27/2014   Left rotator cuff tear arthropathy 09/26/2014   Hearing loss 04/15/2014   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 11/17/2011   Herpes simplex disease 05/27/2010   DERMATITIS, ATOPIC 12/23/2008   HIP PAIN, BILATERAL 03/25/2008   Hypothyroidism 12/04/2007   Hyperlipidemia 08/21/2007   ALLERGIC RHINITIS 08/21/2007   MENOPAUSAL SYNDROME 08/21/2007   ECZEMA 08/21/2007   Osteopenia of spine 08/21/2007    ONSET DATE: 02/22/22   REFERRING DIAG: G31.84 (ICD-10-CM) - Mild cognitive impairment of uncertain or unknown etiology  THERAPY DIAG:  Cognitive communication deficit  Mild cognitive impairment of uncertain or unknown etiology  Rationale for Evaluation and Treatment Rehabilitation  SUBJECTIVE:   SUBJECTIVE STATEMENT: Pt arrived late because she stated she went to the wrong building as she thought she knew where she was going.  Pt accompanied by: self  PERTINENT HISTORY: Pt is a 77 year old right handed female with a past medical history of chronic insomnia, white matter microvascular ischemic and metabolic changes, and mild cognitive impairment with speech difficulty.   06/10/20: Previously seen for Speech Services regarding stuttering at [facility]. Pt was administered the ACE-II version A receiving a total score of 82.  Dysfluency in extended oral reading (561 words) was overall <1%. She was asked to self-identify an example which typified her "stuttering," and she identified a false start on "[place]," when she started to say "veteran," in a story about the history of [employer]. Other errors were mild syllable transpositions, likely due to unfamiliarity with the  text.   SLP reported "pt presents with extremely mild (actually WNL) dysfluency characterized by syllable transpositions and articulatory imprecision, but at a very low rate. Cognitive measures taken today during attempts to increase communicative stress to assess impact on fluency were once again reassuringly WNL. The above was discussed at length with the patient. She was given a HEP of "tongue twisters"  to practice reading aloud to reinforce motor programming skills, as she admits to decreased socialization and opportunities for conversation due to recent relocation followed by COVID. No further treatment is indicated."   DIAGNOSTIC FINDINGS:   SLUMS 11/16/2021: 18/30  MRI Brain with and without contrast 04/17/2020: No evidence of intracranial mass lesion, acute infarct, intracranial hemorrhage, or obstructive hydrocephalus. Evidence of minimal nonacute small vessel ischemic disease of cerebral white matter. Left sphenoid sinus disease, likely inflammatory.  PAIN:  Are you having pain? No   FALLS: Has patient fallen in last 6 months?  No  LIVING ENVIRONMENT: Lives with: lives with their family and lives with their spouse Lives in: House/apartment  PLOF:  Level of assistance: Independent with ADLs, Independent with IADLs Employment: Retired   PATIENT GOALS Have better speech   OBJECTIVE:   COGNITIVE COMMUNICATION Overall cognitive status: Impaired  See below objective assessment  AUDITORY COMPREHENSION  Overall auditory comprehension: Appears intact YES/NO questions: Appears intact Following directions: Appears intact Conversation: Moderately Complex Interfering components: N/A Effective technique: N/A  READING COMPREHENSION: Intact  EXPRESSION: verbal  VERBAL EXPRESSION:   Overall verbal expression: Appears intact Level of generative/spontaneous verbalization: word, phrase, sentence, and conversation Automatic speech: N/A  Repetition: Appears intact Naming: Confrontation: 76-100% Pragmatics: Appears intact Comments: N/A  Interfering components: speech intelligibility  Effective technique: N/A Non-verbal means of communication: N/A  WRITTEN EXPRESSION: Dominant hand: right Written expression: Appears intact  ORAL MOTOR EXAMINATION Facial : WFL Lingual: WFL Velum: WFL Mandible: WFL Cough: WFL Voice: WFL  MOTOR SPEECH: Overall motor speech: Appears  intact - fluctuating Level of impairment: Conversation Respiration: diaphragmatic/abdominal breathing Phonation: normal Resonance: WFL Articulation: Appears intact Intelligibility: Intelligible Motor planning: Appears intact Motor speech errors: N/A Interfering components: N/A Effective technique: slow rate   STANDARDIZED ASSESSMENTS: Addenbrooke's Cognitive Examination - ACE III The Addenbrooke's Cognitive Examination-III (ACE-III) is a brief cognitive test that assesses five cognitive domains. The total score is 100 with higher scores indicating better cognitive functioning. Cut off scores of 88 and 82 are recommended for suspicion of dementia (88 has sensitivity of 1.00 and specificity of 0.96, 82 has sensitivity of 0.93 and specificity of 1.00). American Version C  Attention 13/18  Memory 18/26  Fluency 10/14  Language 24/26  Visuospatial 15/16  TOTAL ACE- III Score 80/100        TODAY'S TREATMENT:  N/A    PATIENT EDUCATION: Education details: results of this assessment, ST POC Person educated: Patient Education method: Explanation and Handouts Education comprehension: verbalized understanding     GOALS: Goals reviewed with patient? Yes  SHORT TERM GOALS: Target date: 10 sessions  Given Min A, pt will demonstrate stuttering modification strategy of cancellation in 3 of 4 opportunities.  Baseline: Goal status: INITIAL  2.  Given Min A, pt will demonstrate stuttering modification strategy of preparatory set in 3 of 4 opportunities.  Baseline:  Goal status: INITIAL  3.  Given Min A, pt will demonstrate stuttering modification strategy of slide out in 3 of 4 opportunities.  Baseline:  Goal status: INITIAL   LONG TERM GOALS: Target date: 05/18/2022 Pt will be Mod I  with use of stuttering modification strategies.  Baseline:  Goal status: INITIAL  ASSESSMENT:  CLINICAL IMPRESSION: Patient is a 77 y.o. female who was seen today for mild cognitive  impairment with speech difficulty. Pt reports speech difficulties that fluctuate, impairment worsens during stressful situations/when under duress. 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   OBJECTIVE IMPAIRMENTS include voice disorder. These impairments are limiting patient from effectively communicating at home and in community. Factors affecting potential to achieve goals and functional outcome are ability to learn/carryover information, co-morbidities, cooperation/participation level, and previous level of function.. Patient will benefit from skilled SLP services to address above impairments and improve overall function.  REHAB POTENTIAL: Good  PLAN: SLP FREQUENCY: 1-2x/week  SLP DURATION: 8 weeks  PLANNED INTERVENTIONS: SLP instruction and feedback and Patient/family education   Nedim Oki B. Rutherford Nail, M.S., CCC-SLP, Mining engineer Certified Brain Injury [place]  [place] Office [phone] Ascom [phone] Fax [phone]

## 2022-03-24 ENCOUNTER — Inpatient Hospital Stay
Admission: RE | Admit: 2022-03-24 | Discharge: 2022-03-24 | Disposition: A | Discharge status: Home / Self Care | Payer: Self-pay | Source: Ambulatory Visit | Attending: *Deleted | Admitting: *Deleted

## 2022-03-24 ENCOUNTER — Other Ambulatory Visit: Payer: Self-pay | Admitting: *Deleted

## 2022-03-24 DIAGNOSIS — Z1231 Encounter for screening mammogram for malignant neoplasm of breast: Secondary | ICD-10-CM

## 2022-03-25 ENCOUNTER — Encounter: Payer: Self-pay | Admitting: Adult Health

## 2022-03-25 ENCOUNTER — Ambulatory Visit: Payer: PPO | Admitting: Adult Health

## 2022-03-25 VITALS — BP 120/70 | HR 60 | Temp 97.8°F | Ht <= 58 in | Wt 119.0 lb

## 2022-03-25 DIAGNOSIS — G4733 Obstructive sleep apnea (adult) (pediatric): Secondary | ICD-10-CM | POA: Diagnosis not present

## 2022-03-25 NOTE — Patient Instructions (Signed)
Call back once you decide on Oral appliance or CPAP for Mild Sleep apnea.  Healthy sleep regimen Do not drive if sleepy Follow up in 6 months and As needed

## 2022-03-25 NOTE — Progress Notes (Signed)
Reviewed and agree with assessment/plan.   Chesley Mires, MD [facility] Pulmonary/Critical Care 03/25/2022, 11:44 AM Pager:  [phone]

## 2022-03-25 NOTE — Progress Notes (Signed)
$'@Patient'z$  ID: Courtney Jensen, female    DOB: February 17, 1945, 77 y.o.   MRN: [id number]  No chief complaint on file.   Referring provider: Crecencio Mc, MD  HPI: 77 year old female seen for sleep consult November 29, 2021 for restless sleep snoring and daytime sleepiness found to have mild sleep apnea  TEST/EVENTS :  Home sleep study done on January 10, 2022 showed mild sleep apnea with AHI at 9.9/hour and SpO2 low at 82%.   03/25/2022 Follow up  OSA  Patient presents for a follow-up visit.  Patient was seen in October for sleep consult for restless sleep, snoring and daytime sleepiness.  He was set up for home sleep study was done on January 10, 2022 that showed mild sleep apnea AHI 9.9/hour and SpO2 low at 82%.  We reviewed her sleep study results in detail went over treatment options.  We discussed oral appliance and CPAP therapy.  Patient wants to think about this and decide on once she has done some research . She says she is sleeping better since starting on Trazodone.   Allergies  Allergen Reactions   Codeine Nausea And Vomiting   Corticosteroids Other (See Comments)    Personality change "I become very mean."   Levothyroxine Hives   Oxycodone-Acetaminophen Other (See Comments)   Doxycycline Rash    Immunization History  Administered Date(s) Administered   Covid-19, Mrna,Vaccine(Spikevax)40yr and older 12/24/2021   Fluad Quad(high Dose 65+) 12/24/2021   Influenza Whole 10/29/2007, 12/17/2008   Influenza, High Dose Seasonal PF 10/27/2014, 11/16/2020   Influenza,inj,Quad PF,6+ Mos 10/29/2012, 11/30/2015   Influenza-Unspecified 10/31/2013   Moderna Sars-Covid-2 Vaccination 04/03/2019, 05/01/2019   PNEUMOCOCCAL CONJUGATE-20 02/14/2022   Pneumococcal Conjugate-13 10/27/2014   Pneumococcal Polysaccharide-23 10/27/2010   Td 12/17/2008   Tdap 10/29/2012   Zoster, Live 10/27/2006    Past Medical History:  Diagnosis Date   Anxiety    Depression    Eczema     Hypothyroidism    Osteoporosis    Recurrent herpes simplex    Thyroid disease     Tobacco History: Social History   Tobacco Use  Smoking Status Former   Packs/day: 0.50   Years: 10.00   Total pack years: 5.00   Types: Cigarettes   Quit date: 04/13/1984   Years since quitting: 37.9  Smokeless Tobacco Not on file   Counseling given: Not Answered   Outpatient Medications Prior to Visit  Medication Sig Dispense Refill   aspirin EC 81 MG tablet Take 1 tablet (81 mg total) by mouth daily. Swallow whole. 90 tablet 1   atorvastatin (LIPITOR) 10 MG tablet Take 1 tablet (10 mg total) by mouth daily. 90 tablet 1   ibuprofen (ADVIL) 200 MG tablet Take by mouth. Take 200 mg by mouth every 6 (six) hours as needed for Pain     thyroid (ARMOUR THYROID) 60 MG tablet TAKE ONE TABLET BY MOUTH EVERY DAY IN THE MORNING, TAKE TWO TABLETS ON FRIDAY 108 tablet 1   traZODone (DESYREL) 100 MG tablet Take 100 mg by mouth at bedtime.     valACYclovir (VALTREX) 500 MG tablet Take 1 to 2 tablets by mouth every day as directed 60 tablet 11   traZODone (DESYREL) 100 MG tablet      No facility-administered medications prior to visit.     Review of Systems:   Constitutional:   No  weight loss, night sweats,  Fevers, chills, fatigue, or  lassitude.  HEENT:   No headaches,  Difficulty swallowing,  Tooth/dental problems, or  Sore throat,                No sneezing, itching, ear ache, nasal congestion, post nasal drip,   CV:  No chest pain,  Orthopnea, PND, swelling in lower extremities, anasarca, dizziness, palpitations, syncope.   GI  No heartburn, indigestion, abdominal pain, nausea, vomiting, diarrhea, change in bowel habits, loss of appetite, bloody stools.   Resp: No shortness of breath with exertion or at rest.  No excess mucus, no productive cough,  No non-productive cough,  No coughing up of blood.  No change in color of mucus.  No wheezing.  No chest wall deformity  Skin: no rash or  lesions.  GU: no dysuria, change in color of urine, no urgency or frequency.  No flank pain, no hematuria   MS:  No joint pain or swelling.  No decreased range of motion.  No back pain.    Physical Exam  BP 120/70 (BP Location: Left Arm, Cuff Size: Normal)   Pulse 60   Temp 97.8 F (36.6 C)   Ht 4' 9.75" (1.467 m)   Wt 119 lb (54 kg)   SpO2 97%   BMI 25.09 kg/m   GEN: A/Ox3; pleasant , NAD, well nourished    HEENT:  [place]/AT,   NOSE-clear, THROAT-clear, no lesions, no postnasal drip or exudate noted.   NECK:  Supple w/ fair ROM; no JVD; normal carotid impulses w/o bruits; no thyromegaly or nodules palpated; no lymphadenopathy.    RESP  Clear  P & A; w/o, wheezes/ rales/ or rhonchi. no accessory muscle use, no dullness to percussion  CARD:  RRR, no m/r/g, no peripheral edema, pulses intact, no cyanosis or clubbing.  GI:   Soft & nt; nml bowel sounds; no organomegaly or masses detected.   Musco: Warm bil, no deformities or joint swelling noted.   Neuro: alert, no focal deficits noted.    Skin: Warm, no lesions or rashes    Lab Results:   BNP No results found for: "BNP"  ProBNP No results found for: "PROBNP"  Imaging:         No data to display          No results found for: "NITRICOXIDE"      Assessment & Plan:   OSA (obstructive sleep apnea) Mild OSA -we discussed oral appliance and CPAP therapy.  Patient education given on sleep apnea.  Patient wants to get some more information about each treatment option.  Patient education literature was given.  We went over options and benefit of each treatment.  Patient says she will contact us back when she has made a decision.  If she decides to go to oral appliance would refer to Dr. Ron Parker or Dr. Toy Cookey in [facility] orthodontics If she decides on CPAP.  Would begin auto CPAP 5 to 15 cm H2O.  Plan  Patient Instructions  Call back once you decide on Oral appliance or CPAP for Mild Sleep apnea.  Healthy  sleep regimen Do not drive if sleepy Follow up in 6 months and As needed         Rexene Edison, NP 03/25/2022

## 2022-03-25 NOTE — Assessment & Plan Note (Signed)
Mild OSA -we discussed oral appliance and CPAP therapy.  Patient education given on sleep apnea.  Patient wants to get some more information about each treatment option.  Patient education literature was given.  We went over options and benefit of each treatment.  Patient says she will contact us back when she has made a decision.  If she decides to go to oral appliance would refer to Dr. Ron Parker or Dr. Toy Cookey in [facility] orthodontics If she decides on CPAP.  Would begin auto CPAP 5 to 15 cm H2O.  Plan  Patient Instructions  Call back once you decide on Oral appliance or CPAP for Mild Sleep apnea.  Healthy sleep regimen Do not drive if sleepy Follow up in 6 months and As needed

## 2022-03-30 ENCOUNTER — Ambulatory Visit: Payer: PPO | Admitting: Speech Pathology

## 2022-03-31 ENCOUNTER — Ambulatory Visit: Payer: PPO | Admitting: Speech Pathology

## 2022-03-31 DIAGNOSIS — R41841 Cognitive communication deficit: Secondary | ICD-10-CM | POA: Diagnosis not present

## 2022-03-31 DIAGNOSIS — G3184 Mild cognitive impairment, so stated: Secondary | ICD-10-CM

## 2022-03-31 DIAGNOSIS — R4789 Other speech disturbances: Secondary | ICD-10-CM

## 2022-03-31 NOTE — Therapy (Addendum)
OUTPATIENT SPEECH LANGUAGE PATHOLOGY TREATMENT NOTE   Patient Name: Courtney Jensen MRN: [id number] DOB:[birth year], 77 y.o., female Today's Date: 03/31/2022  PCP: Deborra Medina, MD REFERRING PROVIDER: Jennings Books, MD  END OF SESSION:   End of Session - 03/31/22 1957     Visit Number 2    Number of Visits 17    Date for SLP Re-Evaluation 05/18/22    Authorization Type [employer] P[employer]    Progress Note Due on Visit 10    SLP Start Time 1300    SLP Stop Time  1400    SLP Time Calculation (min) 60 min    Activity Tolerance Patient tolerated treatment well             Past Medical History:  Diagnosis Date   Anxiety    Depression    Eczema    Hypothyroidism    Osteoporosis    Recurrent herpes simplex    Thyroid disease    Past Surgical History:  Procedure Laterality Date   ABDOMINAL HYSTERECTOMY     BUNIONECTOMY Bilateral    CARPAL TUNNEL RELEASE Bilateral    KNEE ARTHROSCOPY Left    SHOULDER ARTHROSCOPY WITH OPEN ROTATOR CUFF REPAIR Left 12/02/2014   Procedure: left shoulder arthroscopy, arthroscopic debridement, subacromial decompression, mini open rotator cuff repair;  Surgeon: Corky Mull, MD;  Location: [facility] ORS;  Service: Orthopedics;  Laterality: Left;   Patient Active Problem List   Diagnosis Date Noted   Decreased GFR 02/17/2022   Cerebrovascular small vessel disease 02/14/2022   OSA (obstructive sleep apnea) 11/29/2021   Insomnia 11/29/2021   MCI (mild cognitive impairment) with memory loss 08/15/2021   History of Clostridium difficile colitis 08/15/2021   History of Lyme disease 08/15/2021   Hammertoe, bilateral 07/18/2021   Dysarthria 07/18/2021   Collagenous colitis 07/16/2021   Thoracic aortic atherosclerosis ([place]) 10/04/2015   Dizziness and giddiness 04/28/2015   Encounter for Medicare annual wellness exam 10/27/2014   Left rotator cuff tear arthropathy 09/26/2014   Hearing loss 04/15/2014   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 11/17/2011   Herpes simplex disease 05/27/2010   DERMATITIS, ATOPIC 12/23/2008   HIP PAIN, BILATERAL 03/25/2008   Hypothyroidism 12/04/2007   Hyperlipidemia 08/21/2007   ALLERGIC RHINITIS 08/21/2007   MENOPAUSAL SYNDROME 08/21/2007   ECZEMA 08/21/2007   Osteopenia of spine 08/21/2007   ONSET DATE: 02/22/2022  REFERRING DIAG: G31.84 (ICD-10-CM) - Mild cognitive impairment of uncertain or unknown etiology   THERAPY DIAG:  Cognitive communication deficit   Mild cognitive impairment of uncertain or unknown etiology   PERTINENT HISTORY: Pt is a 77 year old right handed female with a past medical history of chronic insomnia, white matter microvascular ischemic and metabolic changes, and mild cognitive impairment with speech difficulty.    06/10/20: Previously seen for Speech Services regarding stuttering at [facility]. Pt was administered the ACE-II version A receiving a total score of 82.  Dysfluency in extended oral reading (561 words) was overall <1%. She was asked to self-identify an example which typified her "stuttering," and she identified a false start on "[place]," when she started to say "veteran," in a story about the history of [employer]. Other errors were mild syllable transpositions, likely due to unfamiliarity with the text.    SLP reported "pt presents with extremely mild (actually WNL) dysfluency characterized by syllable transpositions and articulatory imprecision, but at a very low rate. Cognitive measures taken today during attempts to increase communicative stress to  assess impact on fluency were once again reassuringly WNL. The above was discussed at length with the patient. She was given a HEP of "tongue twisters" to practice reading aloud to reinforce motor programming skills, as she admits to decreased socialization and opportunities for conversation due to recent relocation followed by COVID. No further treatment is indicated."      DIAGNOSTIC FINDINGS:    SLUMS 11/16/2021: 18/30   MRI Brain with and without contrast 04/17/2020: No evidence of intracranial mass lesion, acute infarct, intracranial hemorrhage, or obstructive hydrocephalus. Evidence of minimal nonacute small vessel ischemic disease of cerebral white matter. Left sphenoid sinus disease, likely inflammatory.  Rationale for Evaluation and Treatment Rehabilitation  SUBJECTIVE: pt pleasant, "I have a question for you?"   Pt accompanied by: self  PAIN:  Are you having pain? No  PATIENT GOALS: Have better speech  OBJECTIVE:   TODAY'S TREATMENT: Skilled treatment session focused on pt's speech production goals.   Pt began session by stating that her father-in-law was in the hospital with an acute stroke and would not be available for therapy. To have in patient's chart the depth that her dysfluency has on her life, the Communicative Participation Item Bank (CPIB) was administered.   Communicative Participation Item Bank (CPIB) Age Range: 18+   The Communicative Participation Item Bank (CPIB) is a patient-reported outcomes instrument that targets the construct of communicative participation [employer] et al., 2013). The CPIB was developed with the intent of being appropriate for community-dwelling adults in the variety of conversational situations they frequently engage in as part of life roles at home, at work, and in social and leisure situations. All items in the CPIB start with the stem, 'Does your condition interfere with..' followed by various conversational situations such as 'Making a phone call to get information,' or 'Having a conversation in a noisy place.' Respondents choose from four response categories to rate the level of interference they experience in that situation, ranging from 'Not at all,' to 'Very much.' The item bank consists of 46 items, and a paper-and-pencil 10-item disorder-generic short form is available. The CPIB scores are reported as  T-scores where 50 = the mean of the calibration sample and standard deviation = 10.  Does your condition interfere with talking with people you know?  A LITTLE (value=2) Does your condition interfere with communicating when you need to say something quickly? QUITE A BIT (value=1) Does your condition interfere with talking with people you do NOT know?  A LITTLE (value=2) Does your condition interfere with communicating when you are out in the community (e.g., running errands, appointments)?  A LITTLE (value=2) Does your condition interfere with asking questions in a conversation?  A LITTLE (value=2) Does your condition interfere with communicating in a small group of people? QUITE A BIT (value=1) Does your condition interfere with having a long conversation with someone you know about a book, movie, show or sports event?  A LITTLE (value=2) Does your condition interfere with giving someone DETAILED information?  A LITTLE (value=2) Does your condition interfere with getting your turn in a fast-moving conversation? QUITE A BIT (value=1) Does your condition interfere with trying to persuade a friend or family member to see a different point of view? QUITE A BIT (value=1)  Pt's T-Score is 47.80 which is considered within the SD of +/- 10 mean of 50.     PATIENT EDUCATION: Education details: counselor information Person educated: Patient Education method: Explanation and Handouts Education comprehension: verbalized understanding and returned demonstration  GOALS: Goals reviewed with patient? Yes  SHORT TERM GOALS: Target date: 10 sessions  GOALS: Goals reviewed with patient? Yes   SHORT TERM GOALS: Target date: 10 sessions   Given Min A, pt will demonstrate stuttering modification strategy of cancellation in 3 of 4 opportunities.  Baseline: Goal status: INITIAL   2.  Given Min A, pt will demonstrate stuttering modification strategy of preparatory set in 3 of 4 opportunities.  Baseline:   Goal status: INITIAL   3.  Given Min A, pt will demonstrate stuttering modification strategy of slide out in 3 of 4 opportunities.  Baseline:  Goal status: INITIAL     LONG TERM GOALS: Target date: 05/18/2022 Pt will be Mod I with use of stuttering modification strategies.  Baseline:  Goal status: INITIAL  ASSESSMENT:  CLINICAL IMPRESSION: While pt continues to experience dysfluencies, she isnot able to attend therapy at this time d/t family commitments.   Extensive education provided on potentially following up with some counseling. Information provided. Secure email sent to pt's neurologist and PCP on this recommendation.   Ashlee Player B. Rutherford Nail, M.S., CCC-SLP, Mining engineer Certified Brain Injury [place]  [place] Office [phone] Ascom [phone] Fax [phone]

## 2022-04-01 ENCOUNTER — Ambulatory Visit: Payer: PPO | Admitting: Speech Pathology

## 2022-04-04 ENCOUNTER — Ambulatory Visit: Payer: PPO | Admitting: Speech Pathology

## 2022-04-06 ENCOUNTER — Ambulatory Visit: Payer: PPO | Admitting: Speech Pathology

## 2022-04-08 ENCOUNTER — Encounter: Payer: Self-pay | Admitting: Internal Medicine

## 2022-04-12 ENCOUNTER — Ambulatory Visit: Payer: PPO | Admitting: Speech Pathology

## 2022-04-14 ENCOUNTER — Ambulatory Visit: Payer: PPO | Admitting: Speech Pathology

## 2022-04-18 ENCOUNTER — Encounter: Payer: PPO | Admitting: Speech Pathology

## 2022-04-20 ENCOUNTER — Encounter: Payer: PPO | Admitting: Speech Pathology

## 2022-04-22 ENCOUNTER — Encounter: Payer: Self-pay | Admitting: Internal Medicine

## 2022-05-02 ENCOUNTER — Encounter: Payer: PPO | Admitting: Speech Pathology

## 2022-05-03 MED ORDER — VALACYCLOVIR HCL 500 MG PO TABS: ORAL_TABLET | ORAL | 11 refills | Status: DC

## 2022-05-04 ENCOUNTER — Encounter: Payer: PPO | Admitting: Speech Pathology

## 2022-05-09 ENCOUNTER — Encounter: Payer: PPO | Admitting: Speech Pathology

## 2022-05-11 ENCOUNTER — Encounter: Payer: PPO | Admitting: Speech Pathology

## 2022-05-16 ENCOUNTER — Encounter: Payer: Self-pay | Admitting: Podiatry

## 2022-05-16 ENCOUNTER — Ambulatory Visit: Payer: PPO | Admitting: Podiatry

## 2022-05-16 VITALS — BP 121/65 | HR 52

## 2022-05-16 DIAGNOSIS — M5431 Sciatica, right side: Secondary | ICD-10-CM

## 2022-05-16 DIAGNOSIS — M7741 Metatarsalgia, right foot: Secondary | ICD-10-CM

## 2022-05-16 DIAGNOSIS — M76821 Posterior tibial tendinitis, right leg: Secondary | ICD-10-CM | POA: Diagnosis not present

## 2022-05-16 DIAGNOSIS — M7742 Metatarsalgia, left foot: Secondary | ICD-10-CM | POA: Diagnosis not present

## 2022-05-16 NOTE — Progress Notes (Signed)
Patient presents today to pick up custom molded foot orthotics recommended by Dr. Sharl Ma.   Orthotics were dispensed and fit was satisfactory. Reviewed instructions for break-in and wear. Written instructions given to patient.  Patient will follow up as needed.   [facility] - order # [id number]

## 2022-05-16 NOTE — Progress Notes (Signed)
  Subjective:  Patient ID: Courtney Jensen, female    DOB: [birth year],  MRN: [id number]  Chief Complaint  Patient presents with   Foot Orthotics    "I'm here to pick up my orthotics.  My arch hurts in the right foot."    77 y.o. female presents with the above complaint. History confirmed with patient.  The arch pain is relatively new it is pulling and shooting up into the leg sometimes her leg goes numb, she has had a history of sciatica with shooting pain before, has not seen spine specialist for this.  Objective:  Physical Exam: warm, good capillary refill, no trophic changes or ulcerative lesions, normal DP and PT pulses, normal sensory exam, and  severe hallux valgus deformity, track bound, painful hyperkeratosis palpable tender met heads 2 and 3, contracted hammertoes, minimal congruency of second and third MPJ.  Some tenderness and swelling around the posterior tibial tendon  Assessment:   1. Posterior tibial tendon dysfunction (PTTD) of right lower extremity   2. Metatarsalgia of both feet   3. Sciatica, right side      Plan:  Patient was evaluated and treated and all questions answered.  Suspect she is also dealing with some posterior tibial tendinitis as it is worse with activity.  Recommended physical therapy exercises and topical pain cream which she purchased today.  If truly posterior tibial hide should resolve with this and use of the orthotics.  Orthotics were comfortable and we reviewed the break-in period.  She will let me know if there are any adjustments that need to be made.  Discussed with her if not improving likely could be sciatica or nerve related dysfunction which does concern me that there is numbness developing in the leg, she will let me know if this continues to progress and I can refer her to a spine specialist if needed.  No follow-ups on file.

## 2022-05-16 NOTE — Patient Instructions (Addendum)
Posterior Tibial Tendinitis  Posterior tibial tendinitis is irritation of a tendon called the posterior tibial tendon. Your posterior tibial tendon is a cord-like tissue that connects bones of your lower leg and foot to a muscle that: Supports your arch. Helps you raise up on your toes. Helps you turn your foot down and in. This condition causes foot and ankle pain. It can also lead to a flat foot. What are the causes? This condition is most often caused by repeated stress to the tendon (overuse injury). It can also be caused by a sudden injury that stresses the tendon, such as landing on your foot after jumping or falling. What increases the risk? This condition is more likely to develop in: People who play a sport that involves putting a lot of pressure on the feet, such as: Basketball. Tennis. Soccer. Hockey. Runners. Females who are older than 77 years of age and are overweight. People with diabetes. People with decreased foot stability. People with flat feet. What are the signs or symptoms? Symptoms include: Pain in the inner ankle. Pain at the arch of your foot. Pain that gets worse with running, walking, or standing. Swelling on the inside of your ankle and foot. Weakness in your ankle or foot. Inability to stand up on tiptoe. Flattening of the arch of your foot. How is this diagnosed? This condition may be diagnosed based on: Your symptoms. Your medical history. A physical exam. Tests, such as: X-ray. MRI. Ultrasound. How is this treated? This condition may be treated by: Putting ice to the injured area. Taking NSAIDs, such as ibuprofen, to reduce pain and swelling. Wearing a special shoe or shoe insert to support your arch (orthotic). Having physical therapy. Replacing high-impact exercise with low-impact exercise, such as swimming or cycling. If your symptoms do not improve with these treatments, you may need to wear a splint, removable walking boot, or short  leg cast for 6-8 weeks to keep your foot and ankle still (immobilized). Follow these instructions at home: If you have a cast, splint, or boot: Keep it clean and dry. Check the skin around it every day. Tell your health care provider about any concerns. If you have a cast: Do not stick anything inside it to scratch your skin. Doing that increases your risk of infection. You may put lotion on dry skin around the edges of the cast. Do not put lotion on the skin underneath the cast. If you have a splint or boot: Wear it as told by your health care provider. Remove it only as told by your health care provider. Loosen it if your toes tingle, become numb, or turn cold and blue. Bathing Do not take baths, swim, or use a hot tub until your health care provider approves. Ask your health care provider if you may take showers. If your cast, splint, or boot is not waterproof: Do not let it get wet. Cover it with a waterproof covering while you take a bath or a shower. Managing pain and swelling   If directed, put ice on the injured area. If you have a removable splint or boot, remove it as told by your health care provider. Put ice in a plastic bag. Place a towel between your skin and the bag or between your cast and the bag. Leave the ice on for 20 minutes, 2-3 times a day. Move your toes often to reduce stiffness and swelling. Raise (elevate) the injured area above the level of your heart while you are sitting   or lying down. Activity Do not use the injured foot to support your body weight until your health care provider says that you can. Use crutches as told by your health care provider. Do not do activities that make pain or swelling worse. Ask your health care provider when it is safe to drive if you have a cast, splint, or boot on your foot. Return to your normal activities as told by your health care provider. Ask your health care provider what activities are safe for you. Do exercises as  told by your health care provider. General instructions Take over-the-counter and prescription medicines only as told by your health care provider. If you have an orthotic, use it as told by your health care provider. Keep all follow-up visits as told by your health care provider. This is important. How is this prevented? Wear footwear that is appropriate to your athletic activity. Avoid athletic activities that cause pain or swelling in your ankle or foot. Before being active, do range-of-motion and stretching exercises. If you develop pain or swelling while training, stop training. If you have pain or swelling that does not improve after a few days of rest, see your health care provider. If you start a new athletic activity, start gradually so you can build up your strength and flexibility. Contact a health care provider if: Your symptoms get worse. Your symptoms do not improve in 6-8 weeks. You develop new, unexplained symptoms. Your splint, boot, or cast gets damaged. Summary Posterior tibial tendinitis is irritation of a tendon called the posterior tibial tendon. This condition is most often caused by repeated stress to the tendon (overuse injury). This condition causes foot pain and ankle pain. It can also lead to a flat foot. This condition may be treated by not doing high-impact activities, applying ice, having physical therapy, wearing orthotics, and wearing a cast, splint, or boot if needed. This information is not intended to replace advice given to you by your health care provider. Make sure you discuss any questions you have with your health care provider. Document Revised: 05/15/2018 Document Reviewed: 03/22/2018 Elsevier Patient Education  2020 [employer]  Posterior Tibial Tendinitis Rehab Ask your health care provider which exercises are safe for you. Do exercises exactly as told by your health care provider and adjust them as directed. It is normal to feel mild  stretching, pulling, tightness, or discomfort as you do these exercises. Stop right away if you feel sudden pain or your pain gets worse. Do not begin these exercises until told by your health care provider. Stretching and range-of-motion exercises These exercises warm up your muscles and joints and improve the movement and flexibility in your ankle and foot. These exercises may also help to relieve pain. Standing wall calf stretch, knee straight   Stand with your hands against a wall. Extend your left / right leg behind you, and bend your front knee slightly. If directed, place a folded washcloth under the arch of your foot for support. Point the toes of your back foot slightly inward. Keeping your heels on the floor and your back knee straight, shift your weight toward the wall. Do not allow your back to arch. You should feel a gentle stretch in your upper left / right calf. Hold this position for 10 seconds. Repeat 10 times. Complete this exercise 2 times a day. Standing wall calf stretch, knee bent Stand with your hands against a wall. Extend your left / right leg behind you, and bend your front   knee slightly. If directed, place a folded washcloth under the arch of your foot for support. Point the toes of your back foot slightly inward. Unlock your back knee so it is bent. Keep your heels on the floor. You should feel a gentle stretch deep in your lower left / right calf. Hold this position for 10 seconds. Repeat 10 times. Complete this exercise 2 times a day. Strengthening exercises These exercises build strength and endurance in your ankle and foot. Endurance is the ability to use your muscles for a long time, even after they get tired. Ankle inversion with band Secure one end of a rubber exercise band or tubing to a fixed object, such as a table leg or a pole, that will stay still when the band is pulled. Loop the other end of the band around the middle of your left / right foot. Sit  on the floor facing the object with your left / right leg extended. The band or tube should be slightly tense when your foot is relaxed. Leading with your big toe, slowly bring your left / right foot and ankle inward, toward your other foot (inversion). Hold this position for 10 seconds. Slowly return your foot to the starting position. Repeat 10 times. Complete this exercise 2 times a day. Towel curls   Sit in a chair on a non-carpeted surface, and put your feet on the floor. Place a towel in front of your feet. Keeping your heel on the floor, put your left / right foot on the towel. Pull the towel toward you by grabbing the towel with your toes and curling them under. Keep your heel on the floor while you do this. Let your toes relax. Grab the towel with your toes again. Keep going until the towel is completely underneath your foot. Repeat 10 times. Complete this exercise 2 times a day. Balance exercise This exercise improves or maintains your balance. Balance is important in preventing falls. Single leg stand Without wearing shoes, stand near a railing or in a doorway. You may hold on to the railing or door frame as needed for balance. Stand on your left / right foot. Keep your big toe down on the floor and try to keep your arch lifted. If balancing in this position is too easy, try the exercise with your eyes closed or while standing on a pillow. Hold this position for 10 seconds. Repeat 10 times. Complete this exercise 2 times a day. This information is not intended to replace advice given to you by your health care provider. Make sure you discuss any questions you have with your health care provider.       WEARING INSTRUCTIONS FOR ORTHOTICS  Don't expect to be comfortable wearing your orthotic devices for the first time.  Like eyeglasses, you may be aware of them as time passes, they will not be uncomfortable and you will enjoy wearing them.  FOLLOW THESE INSTRUCTIONS  EXACTLY!  Wear your orthotic devices for:       Not more than 1 hour the first day.       Not more than 2 hours the second day.       Not more than 3 hours the third day and so on.        Or wear them for as long as they feel comfortable.       If you experience discomfort in your feet or legs take them out.  When feet & legs feel  better, put them back in.  You do need to be consistent and wear them a little        everyday. 2.   If at any time the orthotic devices become acutely uncomfortable before the       time for that particular day, STOP WEARING THEM. 3.   On the next day, do not increase the wearing time. 4.   Subsequently, increase the wearing time by 15-30 minutes only if comfortable to do       so. 5.   You will be seen by your doctor about 2-4 weeks after you receive your orthotic       devices, at which time you will probably be wearing your devices comfortably        for about 8 hours or more a day. 6.   Some patients occasionally report mild aches or discomfort in other parts of the of       body such as the knees, hips or back after 3 or 4 consecutive hours of wear.  If this       is the case with you, do not extend your wearing time.  Instead, cut it back an hour or       two.  In all likelihood, these symptoms will disappear in a short period of time as your       body posture realigns itself and functions more efficiently. 7.   It is possible that your orthotic device may require some small changes or adjustment       to improve their function or make them more comfortable.   This is usually not done       before one to three months have elapsed.  These adjustments are made in        accordance with the changed position your feet are assuming as a result of       improved biomechanical function. 8.   In women's shoes, it's not unusual for your heel to slip out of the shoe, particularly if       they are step-in-shoes.  If this is the case, try other shoes or  other styles.  Try to       purchase shoes which have deeper heal seats or higher heel counters. 9.   Squeaking of orthotics devices in the shoes is due to the movement of the devices       when they are functioning normally.  To eliminate squeaking, simply dust some       baby powder into your shoes before inserting the devices.  If this does not work,        apply soap or wax to the edges of the orthotic devices or put a tissue into the shoes. 10. It is important that you follow these directions explicitly.  Failure to do so will simply       prolong the adjustment period or create problems which are easily avoided.  It makes       no difference if you are wearing your orthotic devices for only a few hours after        several months, so long as you are wearing them comfortably for those hours. 11. If you have any questions or complaints, contact our office.  We have no way of       knowing about your problems unless you tell us.  If we do not hear from you, we will  assume that you are proceeding well.

## 2022-05-17 ENCOUNTER — Encounter: Payer: PPO | Admitting: Speech Pathology

## 2022-05-19 ENCOUNTER — Encounter: Payer: PPO | Admitting: Speech Pathology

## 2022-05-24 ENCOUNTER — Encounter: Payer: PPO | Admitting: Speech Pathology

## 2022-05-26 ENCOUNTER — Encounter: Payer: PPO | Admitting: Speech Pathology

## 2022-05-30 ENCOUNTER — Ambulatory Visit: Payer: PPO | Admitting: Podiatry

## 2022-05-31 ENCOUNTER — Encounter: Payer: PPO | Admitting: Speech Pathology

## 2022-06-02 ENCOUNTER — Encounter: Payer: PPO | Admitting: Speech Pathology

## 2022-06-07 ENCOUNTER — Encounter: Payer: PPO | Admitting: Speech Pathology

## 2022-06-09 ENCOUNTER — Encounter: Payer: PPO | Admitting: Speech Pathology

## 2022-06-14 ENCOUNTER — Encounter: Payer: PPO | Admitting: Speech Pathology

## 2022-06-16 ENCOUNTER — Encounter: Payer: PPO | Admitting: Speech Pathology

## 2022-06-21 ENCOUNTER — Encounter: Payer: PPO | Admitting: Speech Pathology

## 2022-06-23 ENCOUNTER — Encounter: Payer: PPO | Admitting: Speech Pathology

## 2022-06-28 ENCOUNTER — Encounter: Payer: PPO | Admitting: Speech Pathology

## 2022-06-29 ENCOUNTER — Telehealth: Payer: Self-pay | Admitting: Podiatry

## 2022-06-29 NOTE — Telephone Encounter (Signed)
Patient wants to order 2nd pair of orthotics , she is aware of charge of $245  I will place order today

## 2022-06-30 ENCOUNTER — Encounter: Payer: PPO | Admitting: Speech Pathology

## 2022-07-14 ENCOUNTER — Other Ambulatory Visit: Payer: Self-pay | Admitting: Internal Medicine

## 2022-07-26 DIAGNOSIS — G3184 Mild cognitive impairment, so stated: Secondary | ICD-10-CM | POA: Diagnosis not present

## 2022-07-26 DIAGNOSIS — F5104 Psychophysiologic insomnia: Secondary | ICD-10-CM | POA: Diagnosis not present

## 2022-07-26 DIAGNOSIS — G939 Disorder of brain, unspecified: Secondary | ICD-10-CM | POA: Diagnosis not present

## 2022-07-27 DIAGNOSIS — H43393 Other vitreous opacities, bilateral: Secondary | ICD-10-CM | POA: Diagnosis not present

## 2022-07-27 DIAGNOSIS — Z961 Presence of intraocular lens: Secondary | ICD-10-CM | POA: Diagnosis not present

## 2022-08-01 ENCOUNTER — Other Ambulatory Visit: Payer: Self-pay | Admitting: Internal Medicine

## 2022-08-01 DIAGNOSIS — Z87891 Personal history of nicotine dependence: Secondary | ICD-10-CM | POA: Diagnosis not present

## 2022-08-01 DIAGNOSIS — E785 Hyperlipidemia, unspecified: Secondary | ICD-10-CM | POA: Diagnosis not present

## 2022-08-01 DIAGNOSIS — Z9849 Cataract extraction status, unspecified eye: Secondary | ICD-10-CM | POA: Diagnosis not present

## 2022-08-01 DIAGNOSIS — E039 Hypothyroidism, unspecified: Secondary | ICD-10-CM | POA: Diagnosis not present

## 2022-08-01 DIAGNOSIS — I700 Atherosclerosis of aorta: Secondary | ICD-10-CM | POA: Diagnosis not present

## 2022-08-01 DIAGNOSIS — F3342 Major depressive disorder, recurrent, in full remission: Secondary | ICD-10-CM | POA: Diagnosis not present

## 2022-08-01 DIAGNOSIS — G4700 Insomnia, unspecified: Secondary | ICD-10-CM | POA: Diagnosis not present

## 2022-08-24 ENCOUNTER — Ambulatory Visit: Payer: PPO | Admitting: Podiatry

## 2022-08-24 DIAGNOSIS — M2042 Other hammer toe(s) (acquired), left foot: Secondary | ICD-10-CM

## 2022-08-24 DIAGNOSIS — M7741 Metatarsalgia, right foot: Secondary | ICD-10-CM | POA: Diagnosis not present

## 2022-08-24 DIAGNOSIS — M76821 Posterior tibial tendinitis, right leg: Secondary | ICD-10-CM

## 2022-08-24 DIAGNOSIS — M7742 Metatarsalgia, left foot: Secondary | ICD-10-CM | POA: Diagnosis not present

## 2022-08-24 DIAGNOSIS — M2041 Other hammer toe(s) (acquired), right foot: Secondary | ICD-10-CM | POA: Diagnosis not present

## 2022-08-24 DIAGNOSIS — M5431 Sciatica, right side: Secondary | ICD-10-CM | POA: Diagnosis not present

## 2022-08-28 NOTE — Progress Notes (Signed)
  Subjective:  Patient ID: Courtney Jensen, female    DOB: [birth year],  MRN: [id number]  Chief Complaint  Patient presents with   Foot Orthotics    "I was called in May about my orthotics.  I had a mold made for them.  I was told it would be $245.  I never heard anything about getting an appointment to pick them up.    I had called and asked about it but no one ever called me back.  Finally, I called.  I was told they are using a different company now.  Will I be charged again?"    77 y.o. female presents with the above complaint. History confirmed with patient.  The arch pain is relatively new it is pulling and shooting up into the leg sometimes her leg goes numb, she has had a history of sciatica with shooting pain before, has not seen spine specialist for this.  Objective:  Physical Exam: warm, good capillary refill, no trophic changes or ulcerative lesions, normal DP and PT pulses, normal sensory exam, and  severe hallux valgus deformity, track bound, painful hyperkeratosis palpable tender met heads 2 and 3, contracted hammertoes, minimal congruency of second and third MPJ.  Some tenderness and swelling around the posterior tibial tendon  Assessment:   1. Sciatica, right side      Plan:  Patient was evaluated and treated and all questions answered.  She was rescanned today for her orthoses.  Her sciatica type pain is still giving her quite a bit of pain.  Referral will be sent to spine surgery.  Follow-up me as needed.  Will notify her when her new orthotics are ready  Return if symptoms worsen or fail to improve.

## 2022-09-01 NOTE — Progress Notes (Signed)
Send the emails to [email or web address] and I will forward them to [employer]

## 2022-09-08 ENCOUNTER — Telehealth: Payer: Self-pay | Admitting: Podiatry

## 2022-09-08 NOTE — Telephone Encounter (Signed)
This pt would like to know where she was referred to about her back pain. Please give her a call

## 2022-09-20 ENCOUNTER — Other Ambulatory Visit: Payer: Self-pay | Admitting: Family Medicine

## 2022-09-20 ENCOUNTER — Inpatient Hospital Stay
Admission: RE | Admit: 2022-09-20 | Discharge: 2022-09-20 | Disposition: A | Discharge status: Home / Self Care | Payer: Self-pay | Source: Ambulatory Visit | Attending: Neurosurgery | Admitting: Neurosurgery

## 2022-09-20 DIAGNOSIS — Z049 Encounter for examination and observation for unspecified reason: Secondary | ICD-10-CM

## 2022-09-23 ENCOUNTER — Telehealth: Payer: Self-pay | Admitting: Podiatry

## 2022-09-23 NOTE — Telephone Encounter (Signed)
Lmom for pt to call back to schedule picking up orthotics.     Balance is 245.oo

## 2022-09-23 NOTE — Progress Notes (Unsigned)
Referring Physician:  Sherlene Shams, MD [address] [place],  [place] [address]  Primary Physician:  Sherlene Shams, MD  History of Present Illness: 09/23/2022 Ms. Courtney Jensen is here today with a chief complaint of ***  sciatica right side  Duration: *** Location: *** Quality: *** Severity: ***  Precipitating: aggravated by *** Modifying factors: made better by *** Weakness: none Timing: *** Bowel/Bladder Dysfunction: none  Conservative measures:  Physical therapy: ***  Multimodal medical therapy including regular antiinflammatories: ***  Injections: *** epidural steroid injections  Past Surgery: ***  Cadedra Abdullah has ***no symptoms of cervical myelopathy.  The symptoms are causing a significant impact on the patient's life.   Review of Systems:  A 10 point review of systems is negative, except for the pertinent positives and negatives detailed in the HPI.  Past Medical History: Past Medical History:  Diagnosis Date   Anxiety    Depression    Eczema    Hypothyroidism    Osteoporosis    Recurrent herpes simplex    Thyroid disease     Past Surgical History: Past Surgical History:  Procedure Laterality Date   ABDOMINAL HYSTERECTOMY     BUNIONECTOMY Bilateral    CARPAL TUNNEL RELEASE Bilateral    KNEE ARTHROSCOPY Left    SHOULDER ARTHROSCOPY WITH OPEN ROTATOR CUFF REPAIR Left 12/02/2014   Procedure: left shoulder arthroscopy, arthroscopic debridement, subacromial decompression, mini open rotator cuff repair;  Surgeon: Christena Flake, MD;  Location: [facility] ORS;  Service: Orthopedics;  Laterality: Left;    Allergies: Allergies as of 09/27/2022 - Review Complete 05/16/2022  Allergen Reaction Noted   Codeine Nausea And Vomiting    Corticosteroids Other (See Comments) 11/18/2014   Levothyroxine Hives 10/01/2015   Oxycodone-acetaminophen Other (See Comments) 04/27/2015   Doxycycline Rash 07/28/2016    Medications: Outpatient Encounter  Medications as of 09/27/2022  Medication Sig   aspirin EC 81 MG tablet Take 1 tablet (81 mg total) by mouth daily. Swallow whole.   atorvastatin (LIPITOR) 10 MG tablet TAKE 1 TABLET BY MOUTH EVERY DAY   ibuprofen (ADVIL) 200 MG tablet Take by mouth. Take 200 mg by mouth every 6 (six) hours as needed for Pain   thyroid (ARMOUR THYROID) 60 MG tablet TAKE ONE TABLET BY MOUTH EVERY DAY IN THE MORNING, TAKE TWO TABLETS ON FRIDAY   traZODone (DESYREL) 100 MG tablet Take 100 mg by mouth at bedtime.   valACYclovir (VALTREX) 500 MG tablet Take 1 to 2 tablets by mouth every day as directed   No facility-administered encounter medications on file as of 09/27/2022.    Social History: Social History   Tobacco Use   Smoking status: Former    Current packs/day: 0.00    Average packs/day: 0.5 packs/day for 10.0 years (5.0 ttl pk-yrs)    Types: Cigarettes    Start date: 04/14/1974    Quit date: 04/13/1984    Years since quitting: 38.4  Substance Use Topics   Alcohol use: Yes    Alcohol/week: 0.0 standard drinks of alcohol    Comment: Wine or mixed drink in the evening   Drug use: No    Family Medical History: Family History  Problem Relation Age of Onset   Arthritis Mother    Heart disease Father    Hypertension Father    Breast cancer Neg Hx     Physical Examination: @VITALWITHPAIN @  General: Patient is well developed, well nourished, calm, collected, and in no apparent distress. Attention to  examination is appropriate.  Psychiatric: Patient is non-anxious.  Head:  Pupils equal, round, and reactive to light.  ENT:  Oral mucosa appears well hydrated.  Neck:   Supple.  ***Full range of motion.  Respiratory: Patient is breathing without any difficulty.  Extremities: No edema.  Vascular: Palpable dorsal pedal pulses.  Skin:   On exposed skin, there are no abnormal skin lesions.  NEUROLOGICAL:     Awake, alert, oriented to person, place, and time.  Speech is clear and fluent.  Fund of knowledge is appropriate.   Cranial Nerves: Pupils equal round and reactive to light.  Facial tone is symmetric.  Facial sensation is symmetric.  ROM of spine: ***full.  Palpation of spine: ***non tender.    Strength: Side Biceps Triceps Deltoid Interossei Grip Wrist Ext. Wrist Flex.  R 5 5 5 5 5 5 5   L 5 5 5 5 5 5 5    Side Iliopsoas Quads Hamstring PF DF EHL  R 5 5 5 5 5 5   L 5 5 5 5 5 5    Reflexes are ***2+ and symmetric at the biceps, triceps, brachioradialis, patella and achilles.   Hoffman's is absent.  Clonus is not present.  Toes are down-going.  Bilateral upper and lower extremity sensation is intact to light touch.    Gait is normal.   No difficulty with tandem gait.   No evidence of dysmetria noted.  Medical Decision Making  Imaging: ***  I have personally reviewed the images and agree with the above interpretation.  Assessment and Plan: Ms. Patrick is a pleasant 77 y.o. female with ***    Thank you for involving me in the care of this patient.   I spent a total of *** minutes in both face-to-face and non-face-to-face activities for this visit on the date of this encounter.   [facility]

## 2022-09-27 ENCOUNTER — Ambulatory Visit: Payer: PPO | Admitting: Neurosurgery

## 2022-09-27 ENCOUNTER — Encounter: Payer: Self-pay | Admitting: Neurosurgery

## 2022-09-27 VITALS — BP 128/82 | Ht 60.0 in | Wt 117.0 lb

## 2022-09-27 DIAGNOSIS — M5416 Radiculopathy, lumbar region: Secondary | ICD-10-CM | POA: Diagnosis not present

## 2022-09-29 ENCOUNTER — Telehealth: Payer: Self-pay | Admitting: Internal Medicine

## 2022-09-29 NOTE — Telephone Encounter (Signed)
Copied from CRM [id number]. Topic: Medicare AWV >> Sep 29, 2022 11:24 AM Payton Doughty wrote: Reason for CRM: LM 09/29/2022 to schedule AWV (both numbers)  Verlee Rossetti; Care Guide Ambulatory Clinical Support [place] [facility] Direct Dial: [phone]

## 2022-10-04 ENCOUNTER — Ambulatory Visit
Admission: RE | Admit: 2022-10-04 | Discharge: 2022-10-04 | Disposition: A | Discharge status: Home / Self Care | Payer: PPO | Source: Ambulatory Visit | Attending: Neurosurgery | Admitting: Neurosurgery

## 2022-10-04 DIAGNOSIS — M5416 Radiculopathy, lumbar region: Secondary | ICD-10-CM | POA: Insufficient documentation

## 2022-10-04 DIAGNOSIS — M4187 Other forms of scoliosis, lumbosacral region: Secondary | ICD-10-CM | POA: Diagnosis not present

## 2022-10-04 DIAGNOSIS — M48061 Spinal stenosis, lumbar region without neurogenic claudication: Secondary | ICD-10-CM | POA: Diagnosis not present

## 2022-10-04 DIAGNOSIS — M5116 Intervertebral disc disorders with radiculopathy, lumbar region: Secondary | ICD-10-CM | POA: Diagnosis not present

## 2022-10-04 DIAGNOSIS — M4726 Other spondylosis with radiculopathy, lumbar region: Secondary | ICD-10-CM | POA: Diagnosis not present

## 2022-10-07 ENCOUNTER — Ambulatory Visit (INDEPENDENT_AMBULATORY_CARE_PROVIDER_SITE_OTHER): Payer: PPO

## 2022-10-07 ENCOUNTER — Encounter: Payer: Self-pay | Admitting: Internal Medicine

## 2022-10-07 NOTE — Progress Notes (Signed)
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   Orthotics were dispensed and fit was satisfactory. Reviewed instructions for break-in and wear. Written instructions given to patient.  Patient will follow up as needed.   Addison Bailey Cped, CFo, CFm

## 2022-10-10 MED ORDER — TRIAMCINOLONE ACETONIDE 0.5 % EX OINT: 1.0000 | TOPICAL_OINTMENT | Freq: Two times a day (BID) | CUTANEOUS | 0 refills | Status: AC

## 2022-10-15 ENCOUNTER — Other Ambulatory Visit: Payer: Self-pay | Admitting: Internal Medicine

## 2022-10-17 ENCOUNTER — Telehealth: Payer: Self-pay | Admitting: Neurosurgery

## 2022-10-17 DIAGNOSIS — M5416 Radiculopathy, lumbar region: Secondary | ICD-10-CM

## 2022-10-17 MED ORDER — MELOXICAM 15 MG PO TABS: 15.0000 mg | ORAL_TABLET | Freq: Every day | ORAL | 1 refills | Status: DC

## 2022-10-17 NOTE — Telephone Encounter (Signed)
MRI Lumbar 10/04/22, she is having severe nerve pain in her back that is shooting into her right leg. Can she have medication to help with this until she is seen for her results? She is only taking ibuprofen but that is not helping. She is aware that Duwayne Heck is in surgery today. [facility]

## 2022-10-17 NOTE — Telephone Encounter (Signed)
Called and advised Courtney Jensen P.A. had sent meloxicam to her pharmacy. Have her stop the OTC motrin.    She should take meloxicam with food and stop if she has any GI upset, patient agreed with the above information

## 2022-10-17 NOTE — Telephone Encounter (Signed)
Will send meloxicam to her pharmacy. Have her stop the OTC motrin.   She should take meloxicam with food and stop if she has any GI upset.

## 2022-10-17 NOTE — Telephone Encounter (Signed)
Called and spoke with patient advised per Drake Leach P.A Could try a prescription strength anti inflammatory medication such as meloxicam or diclofenac. Has she taken these? This would replace her motrin. Patient stated she would like to start with anti inflammatory medication    Danielle discussed starting neurontin/gabapentin with her at her last visit. This helps with nerve pain. Does she want to start this? No , patient stated she wanted to start with anti inflammatory medication first , she will think about the Gabapentin , if we could send the medication to [facility]

## 2022-10-17 NOTE — Telephone Encounter (Signed)
Could try a prescription strength anti inflammatory medication such as meloxicam or diclofenac. Has she taken these? This would replace her motrin.   Danielle discussed starting neurontin/gabapentin with her at her last visit. This helps with nerve pain. Does she want to start this?

## 2022-10-17 NOTE — Addendum Note (Signed)
Addended byDrake Leach on: 10/17/2022 03:13 PM   Modules accepted: Orders

## 2022-11-01 ENCOUNTER — Other Ambulatory Visit: Payer: Self-pay | Admitting: Internal Medicine

## 2022-11-08 ENCOUNTER — Ambulatory Visit: Payer: PPO | Admitting: Neurosurgery

## 2022-11-16 ENCOUNTER — Ambulatory Visit (INDEPENDENT_AMBULATORY_CARE_PROVIDER_SITE_OTHER): Payer: PPO

## 2022-11-16 DIAGNOSIS — Z23 Encounter for immunization: Secondary | ICD-10-CM

## 2022-11-22 ENCOUNTER — Ambulatory Visit: Payer: PPO | Admitting: Neurosurgery

## 2022-11-22 ENCOUNTER — Encounter: Payer: Self-pay | Admitting: Neurosurgery

## 2022-11-22 VITALS — BP 128/78 | Ht 60.0 in | Wt 117.0 lb

## 2022-11-22 DIAGNOSIS — M5416 Radiculopathy, lumbar region: Secondary | ICD-10-CM

## 2022-11-22 DIAGNOSIS — M4316 Spondylolisthesis, lumbar region: Secondary | ICD-10-CM | POA: Diagnosis not present

## 2022-11-22 NOTE — Progress Notes (Signed)
Referring Physician:  Sherlene Shams, MD [address] [place],  [place] [address]  Primary Physician:  Sherlene Shams, MD  History of Present Illness: 11/22/2022 Courtney Jensen is a 77 y.o presenting today for review of lumbar MRI.  She does continue to have burning pain down the length of her right leg particularly with walking for prolonged periods of time but denies any new or worsening symptoms.  She has had some improvement of her pain with anti-inflammatories.   09/27/22 Courtney Jensen is a 77 y.o with a history of anxiety, depression, hypothyroidism, osteoporosis, OSA, and thyroid disease who is here today with a chief complaint of right rating leg pain.  She states that this has been going on for about 2 years and is worse for her during the summer months as she is more active.  The patient reports a significant amount of activity including 10,000 steps per day.  She has intermittent burning pain that radiates from her right buttock down the posterior lateral aspect of her right leg into the side of her calf and top of her foot.  This is worse with activity and improved some with ibuprofen.  She takes anywhere from 1-4 doses of ibuprofen a day.  She denies any similar symptoms on the left.  She is also having some discomfort between her shoulder blades.  Over the course of the last year she has not have any definitive treatment for this.  She has not undergone any MRI, physical therapy, or injections.  She has had injections in her foot in the past which caused her to have an adverse reaction.  Conservative measures:  Physical therapy:  has not participated in Multimodal medical therapy including regular antiinflammatories:  ibuprofen Injections:  has not received epidural steroid injections  Past Surgery: no  Courtney Jensen has no symptoms of cervical myelopathy.  The symptoms are causing a significant impact on the patient's life.   Review of Systems:  A 10  point review of systems is negative, except for the pertinent positives and negatives detailed in the HPI.  Past Medical History: Past Medical History:  Diagnosis Date   Anxiety    Depression    Eczema    Hypothyroidism    Osteoporosis    Recurrent herpes simplex    Thyroid disease     Past Surgical History: Past Surgical History:  Procedure Laterality Date   ABDOMINAL HYSTERECTOMY     BUNIONECTOMY Bilateral    CARPAL TUNNEL RELEASE Bilateral    KNEE ARTHROSCOPY Left    SHOULDER ARTHROSCOPY WITH OPEN ROTATOR CUFF REPAIR Left 12/02/2014   Procedure: left shoulder arthroscopy, arthroscopic debridement, subacromial decompression, mini open rotator cuff repair;  Surgeon: Christena Flake, MD;  Location: [facility] ORS;  Service: Orthopedics;  Laterality: Left;    Allergies: Allergies as of 11/22/2022 - Review Complete 10/17/2022  Allergen Reaction Noted   Codeine Nausea And Vomiting    Corticosteroids Other (See Comments) 11/18/2014   Levothyroxine Hives 10/01/2015   Oxycodone-acetaminophen Other (See Comments) 04/27/2015   Doxycycline Rash 07/28/2016    Medications: Outpatient Encounter Medications as of 11/22/2022  Medication Sig   aspirin EC 81 MG tablet Take 1 tablet (81 mg total) by mouth daily. Swallow whole.   atorvastatin (LIPITOR) 10 MG tablet TAKE 1 TABLET BY MOUTH EVERY DAY   meloxicam (MOBIC) 15 MG tablet Take 1 tablet (15 mg total) by mouth daily. Take with food. Stop motrin when taking this medication.   thyroid (  ARMOUR THYROID) 60 MG tablet TAKE ONE TABLET BY MOUTH EVERY DAY IN THE MORNING, TAKE TWO TABLETS ON FRIDAY   traZODone (DESYREL) 100 MG tablet Take 100 mg by mouth at bedtime.   triamcinolone ointment (KENALOG) 0.5 % Apply 1 Application topically 2 (two) times daily.   valACYclovir (VALTREX) 500 MG tablet Take 1 to 2 tablets by mouth every day as directed   No facility-administered encounter medications on file as of 11/22/2022.    Social History: Social  History   Tobacco Use   Smoking status: Former    Current packs/day: 0.00    Average packs/day: 0.5 packs/day for 10.0 years (5.0 ttl pk-yrs)    Types: Cigarettes    Start date: 04/14/1974    Quit date: 04/13/1984    Years since quitting: 38.6  Substance Use Topics   Alcohol use: Yes    Alcohol/week: 0.0 standard drinks of alcohol    Comment: Wine or mixed drink in the evening   Drug use: No    Family Medical History: Family History  Problem Relation Age of Onset   Arthritis Mother    Heart disease Father    Hypertension Father    Breast cancer Neg Hx     Physical Examination: There were no vitals filed for this visit.  There is no height or weight on file to calculate BMI.   General: Patient is well developed, well nourished, calm, collected, and in no apparent distress. Attention to examination is appropriate.  Psychiatric: Patient is non-anxious.  Head:  Pupils equal, round, and reactive to light.  ENT:  Oral mucosa appears well hydrated.  Neck:   Supple.  Full range of motion.  Respiratory: Patient is breathing without any difficulty.  Extremities: No edema.  Vascular: Palpable dorsal pedal pulses.  Skin:   On exposed skin, there are no abnormal skin lesions.  NEUROLOGICAL:     Awake, alert, oriented to person, place, and time.  Speech is clear and fluent. Fund of knowledge is appropriate.   Cranial Nerves: Pupils equal round and reactive to light.  Facial tone is symmetric.  Facial sensation is symmetric.  ROM of spine: full.  Palpation of spine: non tender.    Strength: Side Biceps Triceps Deltoid Interossei Grip Wrist Ext. Wrist Flex.  R 5 5 5 5 5 5 5   L 5 5 5 5 5 5 5    Side Iliopsoas Quads Hamstring PF DF EHL  R 5 5 5 5 5 5   L 5 5 5 5 5 5    Reflexes are 1+ and symmetric at the biceps, triceps, brachioradialis, patella and achilles.   Hoffman's is absent.  Clonus is not present.  Toes are down-going.  Bilateral upper and lower extremity  sensation is intact to light touch.    Gait is normal.     Imaging: MRI L spine 10/04/22 FINDINGS: Segmentation:  5 lumbar type vertebral bodies assumed.   Alignment: Scoliotic curvature convex to the right with the apex at L3. 2 mm degenerative anterolisthesis at L3-4. 8 mm degenerative anterolisthesis at L4-5.   Vertebrae: No fracture or focal bone lesion. Mild edematous degenerative endplate changes on the right at L4-5 could relate to regional pain. Edema of the degenerative facet joints at L4-5 and L5-S1, right more than left.   Conus medullaris and cauda equina: Conus extends to the L1 level. Conus and cauda equina appear normal.   Paraspinal and other soft tissues: Negative   Disc levels:   No significant disc finding  at T12-L1 or L1-2.  No stenosis.   L2-3: Mild bulging of the disc. Mild facet and ligamentous hypertrophy. No compressive stenosis.   L3-4: Facet arthropathy with 2 mm of degenerative anterolisthesis. Bulging of the disc with a focal protrusion in the left foraminal region. Mild narrowing of the lateral recesses without apparent neural compression in those locations. Left foraminal encroachment by disc material could affect the left L3 nerve.   L4-5: Chronic facet arthropathy with anterolisthesis of 8 mm. Bulging of the disc. Stenosis of the lateral recesses and foramina, right more than left. Neural compression could occur at this level, particularly on the right. Facet arthritis could be a cause of back pain or referred facet syndrome pain.   L5-S1: Mild bulging of the disc. No stenosis of the canal or foramina. Mild facet osteoarthritis.   IMPRESSION: 1. Scoliotic curvature convex to the right with the apex at L3. 2 mm of degenerative anterolisthesis at L3-4. 8 mm of degenerative anterolisthesis at L4-5. 2. L3-4: Facet arthropathy with 2 mm of degenerative anterolisthesis. Bulging of the disc with a focal protrusion in the left foraminal  region. Mild narrowing of the lateral recesses without apparent neural compression in those locations. Left foraminal encroachment by disc material could affect the left L3 nerve. 3. L4-5: Chronic facet arthropathy with anterolisthesis of 8 mm. Bulging of the disc. Stenosis of the lateral recesses and foramina, right more than left. Neural compression could occur at this level, particularly on the right. Facet arthritis could be a cause of back pain or referred facet syndrome pain.     Electronically Signed   By: Paulina Fusi M.D.   On: 10/19/2022 18:28  Assessment and Plan: Courtney Jensen is a pleasant 77 y.o. female with about a 2-year history of intermittent right radiating leg pain concerning for lumbar radiculopathy.  We reviewed her lumbar MRI which does show multilevel degenerative changes however her symptoms most correlate with an L4-5 right sided radiculopathy.  She has a anterolisthesis at this level with corresponding foraminal and lateral recess stenosis.  We discussed her symptoms and treatment options to include physical therapy, injections, and possible evaluation for surgery.  She would like to start with physical therapy as she has had unpleasant experiences with steroid injections in her shoulders in the past.  Ultimately if she does not improve with physical therapy and would like to be evaluated further for surgical intervention, we can get her scheduled to see Dr. Myer Haff or Dr. Katrinka Blazing to discuss this further.  I will otherwise see her going forward on an as-needed basis.  She expressed understanding was in agreement with this plan.   I spent a total of 35 minutes in both face-to-face and non-face-to-face activities for this visit on the date of this encounter including review of records, review of MRI, discussion of diagnosis, discussion of treatment options, physical exam, order placement, and documentation.   [facility]

## 2022-12-06 DIAGNOSIS — M5451 Vertebrogenic low back pain: Secondary | ICD-10-CM | POA: Diagnosis not present

## 2022-12-06 DIAGNOSIS — M79604 Pain in right leg: Secondary | ICD-10-CM | POA: Diagnosis not present

## 2022-12-12 ENCOUNTER — Telehealth: Payer: Self-pay | Admitting: Orthopedic Surgery

## 2022-12-12 DIAGNOSIS — M5451 Vertebrogenic low back pain: Secondary | ICD-10-CM | POA: Diagnosis not present

## 2022-12-12 DIAGNOSIS — M79604 Pain in right leg: Secondary | ICD-10-CM | POA: Diagnosis not present

## 2022-12-12 DIAGNOSIS — M5416 Radiculopathy, lumbar region: Secondary | ICD-10-CM

## 2022-12-12 NOTE — Telephone Encounter (Signed)
Sent mobic refill to her pharmacy. Looks like she does not have a f/u scheduled. She should get further refills from her PCP.   Please let her know.

## 2022-12-12 NOTE — Telephone Encounter (Signed)
Husband notified of the refill and any other refills will need to come from PCP.

## 2022-12-13 DIAGNOSIS — M79604 Pain in right leg: Secondary | ICD-10-CM | POA: Diagnosis not present

## 2022-12-13 DIAGNOSIS — M5451 Vertebrogenic low back pain: Secondary | ICD-10-CM | POA: Diagnosis not present

## 2022-12-16 ENCOUNTER — Ambulatory Visit (INDEPENDENT_AMBULATORY_CARE_PROVIDER_SITE_OTHER): Payer: PPO | Admitting: Internal Medicine

## 2022-12-16 ENCOUNTER — Other Ambulatory Visit (HOSPITAL_COMMUNITY)
Admission: RE | Admit: 2022-12-16 | Discharge: 2022-12-16 | Disposition: A | Discharge status: Home / Self Care | Payer: PPO | Source: Ambulatory Visit | Attending: Internal Medicine | Admitting: Internal Medicine

## 2022-12-16 ENCOUNTER — Encounter: Payer: Self-pay | Admitting: Internal Medicine

## 2022-12-16 VITALS — BP 112/66 | HR 55 | Temp 98.0°F | Ht 60.0 in | Wt 116.6 lb

## 2022-12-16 DIAGNOSIS — E782 Mixed hyperlipidemia: Secondary | ICD-10-CM

## 2022-12-16 DIAGNOSIS — F8089 Other developmental disorders of speech and language: Secondary | ICD-10-CM

## 2022-12-16 DIAGNOSIS — N761 Subacute and chronic vaginitis: Secondary | ICD-10-CM

## 2022-12-16 DIAGNOSIS — N898 Other specified noninflammatory disorders of vagina: Secondary | ICD-10-CM | POA: Diagnosis not present

## 2022-12-16 DIAGNOSIS — R471 Dysarthria and anarthria: Secondary | ICD-10-CM | POA: Diagnosis not present

## 2022-12-16 DIAGNOSIS — E034 Atrophy of thyroid (acquired): Secondary | ICD-10-CM

## 2022-12-16 DIAGNOSIS — A6000 Herpesviral infection of urogenital system, unspecified: Secondary | ICD-10-CM

## 2022-12-16 DIAGNOSIS — R944 Abnormal results of kidney function studies: Secondary | ICD-10-CM

## 2022-12-16 LAB — LIPID PANEL
Cholesterol: 154 mg/dL (ref 0–200)
HDL: 76.8 mg/dL (ref >39.00)
LDL Cholesterol: 64 mg/dL (ref 0–99)
NonHDL: 77.43
Total CHOL/HDL Ratio: 2
Triglycerides: 66 mg/dL (ref 0.0–149.0)
VLDL: 13.2 mg/dL (ref 0.0–40.0)

## 2022-12-16 LAB — COMPREHENSIVE METABOLIC PANEL
ALT: 25 U/L (ref 0–35)
AST: 31 U/L (ref 0–37)
Albumin: 4.5 g/dL (ref 3.5–5.2)
Alkaline Phosphatase: 66 U/L (ref 39–117)
BUN: 14 mg/dL (ref 6–23)
CO2: 28 meq/L (ref 19–32)
Calcium: 9.4 mg/dL (ref 8.4–10.5)
Chloride: 107 meq/L (ref 96–112)
Creatinine, Ser: 0.83 mg/dL (ref 0.40–1.20)
GFR: 67.87 mL/min (ref >60.00)
Glucose, Bld: 90 mg/dL (ref 70–99)
Potassium: 4 meq/L (ref 3.5–5.1)
Sodium: 142 meq/L (ref 135–145)
Total Bilirubin: 0.6 mg/dL (ref 0.2–1.2)
Total Protein: 6.6 g/dL (ref 6.0–8.3)

## 2022-12-16 LAB — POC URINALSYSI DIPSTICK (AUTOMATED)
Bilirubin, UA: NEGATIVE
Blood, UA: NEGATIVE
Glucose, UA: NEGATIVE
Ketones, UA: NEGATIVE
Leukocytes, UA: NEGATIVE
Nitrite, UA: NEGATIVE
Protein, UA: NEGATIVE
Spec Grav, UA: 1.025 (ref 1.010–1.025)
Urobilinogen, UA: NEGATIVE U/dL — AB
pH, UA: 5.5 (ref 5.0–8.0)

## 2022-12-16 LAB — TSH: TSH: 1.71 u[IU]/mL (ref 0.35–5.50)

## 2022-12-16 MED ORDER — THYROID 60 MG PO TABS: ORAL_TABLET | ORAL | 1 refills | Status: DC

## 2022-12-16 MED ORDER — VALACYCLOVIR HCL 1 G PO TABS: 1000.0000 mg | ORAL_TABLET | Freq: Two times a day (BID) | ORAL | 0 refills | Status: DC

## 2022-12-16 MED ORDER — ESTRADIOL 0.1 MG/GM VA CREA: TOPICAL_CREAM | VAGINAL | 0 refills | Status: DC

## 2022-12-16 NOTE — Progress Notes (Unsigned)
Subjective:  Patient ID: Courtney Jensen, female    DOB: 04-04-1945  Age: 77 y.o. MRN: [id number]  CC: The primary encounter diagnosis was Vaginal itching. 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   HPI Courtney Jensen  is a 77 yr old female with a history of genital herpes managed with daily valacyclovir who presents for EVALUATION  OF DYSURIA THAT HAS BEEN INTERMITTENT FOR THE PAST 6 MONTHS.  She denies hematuria, flank pain. Nausea and fevers.  No vaginal discharge. She has not been sexually active in several years,  does not swim or use public or private pools  or soak in tubs.   Has not had UA /culture . HAS USED VAGISIL AND MONISTAT  prn when symptoms occur , with TRANSIENT RESULTS   2) Dysarthria:  reviewed prior workup .  Seen by Neurology, mild cognitive impairment by SLUMs score.  Minimal  progression of symptoms .  Was supposed to be referred to psychology after completing therapy with Happi Overton (speech therapy).   Outpatient Medications Prior to Visit  Medication Sig Dispense Refill   aspirin EC 81 MG tablet Take 1 tablet (81 mg total) by mouth daily. Swallow whole. 90 tablet 1   atorvastatin (LIPITOR) 10 MG tablet TAKE 1 TABLET BY MOUTH EVERY DAY 90 tablet 1   meloxicam (MOBIC) 15 MG tablet Take 1 tablet (15 mg total) by mouth daily. Take with food. Stop motrin when taking this medication. 30 tablet 0   traZODone (DESYREL) 100 MG tablet Take 100 mg by mouth at bedtime.     triamcinolone ointment (KENALOG) 0.5 % Apply 1 Application topically 2 (two) times daily. 80 g 0   valACYclovir (VALTREX) 500 MG tablet Take 1 to 2 tablets by mouth every day as directed 60 tablet 11   thyroid (ARMOUR THYROID) 60 MG tablet TAKE ONE TABLET BY MOUTH EVERY DAY IN THE MORNING,  TAKE TWO TABLETS ON FRIDAY 104 tablet 0   No facility-administered medications prior to visit.    Review of Systems;  Patient denies headache, fevers, malaise, unintentional weight loss, skin rash, eye pain, sinus congestion and sinus pain, sore throat, dysphagia,  hemoptysis , cough, dyspnea, wheezing, chest pain, palpitations, orthopnea, edema, abdominal pain, nausea, melena, diarrhea, constipation, flank pain,  hematuria, urinary  Frequency, nocturia, numbness, tingling, seizures,  Focal weakness, Loss of consciousness,  Tremor, insomnia, depression, anxiety, and suicidal ideation.      Objective:  BP 112/66   Pulse (!) 55   Temp 98 F (36.7 C)   Ht 5' (1.524 m)   Wt 116 lb 9.6 oz (52.9 kg)   SpO2 98%   BMI 22.77 kg/m   BP Readings from Last 3 Encounters:  12/16/22 112/66  11/22/22 128/78  09/27/22 128/82    Wt Readings from Last 3 Encounters:  12/16/22 116 lb 9.6 oz (52.9 kg)  11/22/22 117 lb (53.1 kg)  09/27/22 117 lb (53.1 kg)    Physical Exam Genitourinary:    Labia:        Left: Rash present.      Vagina: Normal. No vaginal discharge.     Cervix: Normal.     Uterus: Normal.      Adnexa: Right adnexa normal and left adnexa normal.       Comments: Erythematous rash without vesicles    Lab  Results  Component Value Date   HGBA1C 5.6 07/16/2021    Lab Results  Component Value Date   CREATININE 0.83 12/16/2022   CREATININE 0.79 03/07/2022   CREATININE 0.98 02/14/2022    Lab Results  Component Value Date   WBC 4.9 07/16/2021   HGB 13.4 07/16/2021   HCT 41.1 07/16/2021   PLT 166.0 07/16/2021   GLUCOSE 90 12/16/2022   CHOL 154 12/16/2022   TRIG 66.0 12/16/2022   HDL 76.80 12/16/2022   LDLDIRECT 49.0 02/14/2022   LDLCALC 64 12/16/2022   ALT 25 12/16/2022   AST 31 12/16/2022   NA 142 12/16/2022   K 4.0 12/16/2022   CL 107 12/16/2022   CREATININE 0.83 12/16/2022   BUN 14 12/16/2022   CO2 28 12/16/2022   TSH 1.71 12/16/2022   HGBA1C 5.6  07/16/2021    MR LUMBAR SPINE WO CONTRAST  Result Date: 10/19/2022 CLINICAL DATA:  Lumbar radiculopathy. Symptoms persist with greater than 6 weeks of treatment. Low back pain extends to the right leg. EXAM: MRI LUMBAR SPINE WITHOUT CONTRAST TECHNIQUE: Multiplanar, multisequence MR imaging of the lumbar spine was performed. No intravenous contrast was administered. COMPARISON:  None Available. FINDINGS: Segmentation:  5 lumbar type vertebral bodies assumed. Alignment: Scoliotic curvature convex to the right with the apex at L3. 2 mm degenerative anterolisthesis at L3-4. 8 mm degenerative anterolisthesis at L4-5. Vertebrae: No fracture or focal bone lesion. Mild edematous degenerative endplate changes on the right at L4-5 could relate to regional pain. Edema of the degenerative facet joints at L4-5 and L5-S1, right more than left. Conus medullaris and cauda equina: Conus extends to the L1 level. Conus and cauda equina appear normal. Paraspinal and other soft tissues: Negative Disc levels: No significant disc finding at T12-L1 or L1-2.  No stenosis. L2-3: Mild bulging of the disc. Mild facet and ligamentous hypertrophy. No compressive stenosis. L3-4: Facet arthropathy with 2 mm of degenerative anterolisthesis. Bulging of the disc with a focal protrusion in the left foraminal region. Mild narrowing of the lateral recesses without apparent neural compression in those locations. Left foraminal encroachment by disc material could affect the left L3 nerve. L4-5: Chronic facet arthropathy with anterolisthesis of 8 mm. Bulging of the disc. Stenosis of the lateral recesses and foramina, right more than left. Neural compression could occur at this level, particularly on the right. Facet arthritis could be a cause of back pain or referred facet syndrome pain. L5-S1: Mild bulging of the disc. No stenosis of the canal or foramina. Mild facet osteoarthritis. IMPRESSION: 1. Scoliotic curvature convex to the right with the apex  at L3. 2 mm of degenerative anterolisthesis at L3-4. 8 mm of degenerative anterolisthesis at L4-5. 2. L3-4: Facet arthropathy with 2 mm of degenerative anterolisthesis. Bulging of the disc with a focal protrusion in the left foraminal region. Mild narrowing of the lateral recesses without apparent neural compression in those locations. Left foraminal encroachment by disc material could affect the left L3 nerve. 3. L4-5: Chronic facet arthropathy with anterolisthesis of 8 mm. Bulging of the disc. Stenosis of the lateral recesses and foramina, right more than left. Neural compression could occur at this level, particularly on the right. Facet arthritis could be a cause of back pain or referred facet syndrome pain. Electronically Signed   By: Paulina Fusi M.D.   On: 10/19/2022 18:28    Assessment & Plan:  .Vaginal itching -     Cervicovaginal ancillary only -     POCT Urinalysis Dipstick (  Automated) -     Urinalysis, Routine w reflex microscopic -     Urine Culture  Chronic vaginitis Assessment & Plan: Exam and normal UA/culture  suggest recent herpes outbreak.  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   Orders: -     valACYclovir HCl; Take 1 tablet (1,000 mg total) by mouth 2 (two) times daily.  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  Referral in progress .     Decreased GFR Assessment & Plan: Resolved.  No further workup  Lab Results  Component Value Date   CREATININE 0.83 12/16/2022      Other orders -     Estradiol; Place a small dab on urethra and labia every night FOR 14 NIGHTS then twice weekly thereafter  Dispense: 42.5 g; Refill: 0     I provided 34 minutes of face-to-face time during this encounter reviewing patient's last visit with me, patient's  most recent visit with neurology,  recent surgical and non surgical procedures, previous  labs and imaging studies, counseling on currently addressed issues,  and post visit ordering to diagnostics and therapeutics .   Follow-up: No follow-ups on file.   Sherlene Shams, MD

## 2022-12-16 NOTE — Patient Instructions (Signed)
Your vaginal pain may be due to a recent herpes outbreak and atrophic vaginitis   I have prescribed 1000 mg of valacyclovir  to take 2 times daily for 7 days.  This is a new prescription sent to your local pharmacy.  Then resume your previous valacyclovir daily dose for prevention     I am also prescribing  estrogen cream  for  treatment of "atrophic vaginitis"   Apply a small dab to your urethral opening (where your urine comes out)  and to your labia  every night for 2 weeks  After 2 weeks reduce your use to every 3 or 4 days ( 2 times per week)

## 2022-12-17 LAB — URINALYSIS, ROUTINE W REFLEX MICROSCOPIC
Bilirubin Urine: NEGATIVE
Glucose, UA: NEGATIVE
Hgb urine dipstick: NEGATIVE
Ketones, ur: NEGATIVE
Leukocytes,Ua: NEGATIVE
Nitrite: NEGATIVE
Protein, ur: NEGATIVE
Specific Gravity, Urine: 1.017 (ref 1.001–1.035)
pH: 5.5 (ref 5.0–8.0)

## 2022-12-17 LAB — URINE CULTURE
MICRO NUMBER:: 15736981
Result:: NO GROWTH
SPECIMEN QUALITY:: ADEQUATE

## 2022-12-18 DIAGNOSIS — N760 Acute vaginitis: Secondary | ICD-10-CM | POA: Insufficient documentation

## 2022-12-18 NOTE — Assessment & Plan Note (Signed)
Resolved.  No further workup  Lab Results  Component Value Date   CREATININE 0.83 12/16/2022

## 2022-12-18 NOTE — Assessment & Plan Note (Signed)
Exam and normal UA/culture  suggest recent herpes outbreak.  Will prescribd treatement fose of valacyclovir,  vaginal culture still pending

## 2022-12-18 NOTE — Assessment & Plan Note (Signed)
Rx valacyclovir treatment dose.

## 2022-12-18 NOTE — Assessment & Plan Note (Addendum)
S/p comprehensive evaluation including MRI , neurology consult and speech therapy  ("mixed alzheimers and vascular dementia given no chronic small vessel ischemic changes by MRI")     Has completed speech therapy  ; psychology referral discussed as this may be a conversion disorder.  Referral in progress .

## 2022-12-19 LAB — CERVICOVAGINAL ANCILLARY ONLY
Bacterial Vaginitis (gardnerella): NEGATIVE
Candida Glabrata: NEGATIVE
Candida Vaginitis: NEGATIVE
Comment: NEGATIVE
Comment: NEGATIVE
Comment: NEGATIVE

## 2022-12-20 DIAGNOSIS — M5451 Vertebrogenic low back pain: Secondary | ICD-10-CM | POA: Diagnosis not present

## 2022-12-20 DIAGNOSIS — M79604 Pain in right leg: Secondary | ICD-10-CM | POA: Diagnosis not present

## 2022-12-22 ENCOUNTER — Telehealth: Payer: Self-pay | Admitting: Internal Medicine

## 2022-12-22 NOTE — Telephone Encounter (Signed)
Tina from [facility] would like the cma call her about a referral for the pt

## 2022-12-23 NOTE — Telephone Encounter (Signed)
LMTCB

## 2022-12-26 DIAGNOSIS — M79604 Pain in right leg: Secondary | ICD-10-CM | POA: Diagnosis not present

## 2022-12-26 DIAGNOSIS — M5451 Vertebrogenic low back pain: Secondary | ICD-10-CM | POA: Diagnosis not present

## 2022-12-27 ENCOUNTER — Encounter: Payer: Self-pay | Admitting: Internal Medicine

## 2022-12-27 DIAGNOSIS — M5451 Vertebrogenic low back pain: Secondary | ICD-10-CM | POA: Diagnosis not present

## 2022-12-27 DIAGNOSIS — M79604 Pain in right leg: Secondary | ICD-10-CM | POA: Diagnosis not present

## 2023-01-02 DIAGNOSIS — M5451 Vertebrogenic low back pain: Secondary | ICD-10-CM | POA: Diagnosis not present

## 2023-01-02 DIAGNOSIS — M79604 Pain in right leg: Secondary | ICD-10-CM | POA: Diagnosis not present

## 2023-01-02 NOTE — Telephone Encounter (Signed)
Left message with husband to have pt give [place] a call back.

## 2023-01-02 NOTE — Telephone Encounter (Signed)
Inetta Fermo from [facility] called and stated that she is not taking new patients right now. She stated that she hopes to be after the new year.

## 2023-01-03 DIAGNOSIS — M79604 Pain in right leg: Secondary | ICD-10-CM | POA: Diagnosis not present

## 2023-01-03 DIAGNOSIS — M5451 Vertebrogenic low back pain: Secondary | ICD-10-CM | POA: Diagnosis not present

## 2023-01-04 ENCOUNTER — Encounter: Payer: Self-pay | Admitting: Internal Medicine

## 2023-01-04 DIAGNOSIS — A6000 Herpesviral infection of urogenital system, unspecified: Secondary | ICD-10-CM

## 2023-01-04 MED ORDER — VALACYCLOVIR HCL 1 G PO TABS: 1000.0000 mg | ORAL_TABLET | Freq: Two times a day (BID) | ORAL | 0 refills | Status: DC

## 2023-01-04 NOTE — Telephone Encounter (Signed)
See mychart message. Pt stated that she is going to wait until January to do the counseling.

## 2023-01-05 ENCOUNTER — Telehealth: Payer: Self-pay | Admitting: Orthopedic Surgery

## 2023-01-05 DIAGNOSIS — M5416 Radiculopathy, lumbar region: Secondary | ICD-10-CM

## 2023-01-05 NOTE — Telephone Encounter (Signed)
She was advised last month to get further refills of mobic from her PCP. Please let her know. Mobic refill denied.

## 2023-01-05 NOTE — Telephone Encounter (Signed)
Patient notified and expressed understanding.

## 2023-01-09 DIAGNOSIS — M5451 Vertebrogenic low back pain: Secondary | ICD-10-CM | POA: Diagnosis not present

## 2023-01-09 DIAGNOSIS — M79604 Pain in right leg: Secondary | ICD-10-CM | POA: Diagnosis not present

## 2023-01-10 DIAGNOSIS — M79604 Pain in right leg: Secondary | ICD-10-CM | POA: Diagnosis not present

## 2023-01-10 DIAGNOSIS — M5451 Vertebrogenic low back pain: Secondary | ICD-10-CM | POA: Diagnosis not present

## 2023-02-02 ENCOUNTER — Encounter: Payer: Self-pay | Admitting: Internal Medicine

## 2023-02-28 ENCOUNTER — Other Ambulatory Visit: Payer: Self-pay | Admitting: Internal Medicine

## 2023-03-28 ENCOUNTER — Other Ambulatory Visit: Payer: Self-pay | Admitting: Internal Medicine

## 2023-03-28 MED ORDER — VALACYCLOVIR HCL 500 MG PO TABS: ORAL_TABLET | ORAL | 3 refills | Status: DC

## 2023-06-26 ENCOUNTER — Encounter: Payer: Self-pay | Admitting: Internal Medicine

## 2023-06-28 ENCOUNTER — Encounter: Payer: Self-pay | Admitting: Internal Medicine

## 2023-06-28 ENCOUNTER — Other Ambulatory Visit: Payer: Self-pay | Admitting: Internal Medicine

## 2023-06-28 MED ORDER — MELOXICAM 15 MG PO TABS: 15.0000 mg | ORAL_TABLET | Freq: Every day | ORAL | 0 refills | Status: DC

## 2023-06-28 MED ORDER — PANTOPRAZOLE SODIUM 40 MG PO TBEC: 40.0000 mg | DELAYED_RELEASE_TABLET | Freq: Every day | ORAL | 2 refills | Status: DC

## 2023-07-06 ENCOUNTER — Encounter: Payer: Self-pay | Admitting: Internal Medicine

## 2023-07-10 ENCOUNTER — Other Ambulatory Visit: Payer: Self-pay | Admitting: Internal Medicine

## 2023-07-18 ENCOUNTER — Telehealth: Payer: Self-pay | Admitting: *Deleted

## 2023-07-18 ENCOUNTER — Ambulatory Visit (INDEPENDENT_AMBULATORY_CARE_PROVIDER_SITE_OTHER): Admitting: *Deleted

## 2023-07-18 VITALS — Ht 60.0 in | Wt 113.0 lb

## 2023-07-18 DIAGNOSIS — Z0000 Encounter for general adult medical examination without abnormal findings: Secondary | ICD-10-CM | POA: Diagnosis not present

## 2023-07-18 NOTE — Telephone Encounter (Signed)
 Performed AWV today. While on the phone patient complained of a rash off and on for several months. Patient stated that the rash at times is all over her body but primarily seems to be in areas where she sweats. Patient stated that she has been taking Claritin at times which seems to help. Patient stated that she would like an appointment on a Monday or Tuesday. Patient was offered and earlier appointment which she declined. Patient scheduled an appointment with Dr. Narendra 07/24/23. Patient was seen by her PCP for an acute visit 12/16/22 and requested an appointment with Dr. Tullo since she has not seen her for a while. Appointment scheduled for 09/19/23 with Dr. Tullo.

## 2023-07-18 NOTE — Telephone Encounter (Signed)
 noted

## 2023-07-18 NOTE — Progress Notes (Signed)
 Subjective:   Courtney Jensen is a 78 y.o. who presents for a Medicare Wellness preventive visit.  As a reminder, Annual Wellness Visits Jensen't include a physical exam, and some assessments may be limited, especially if this visit is performed virtually. We may recommend an in-person follow-up visit with your provider if needed.  Visit Complete: Virtual I connected with  Courtney Jensen on 07/18/23 by a audio enabled telemedicine application and verified that I am speaking with the correct person using two identifiers.  Patient Location: Home  Provider Location: Home Office  I discussed the limitations of evaluation and management by telemedicine. The patient expressed understanding and agreed to proceed.  Vital Signs: Because this visit was a virtual/telehealth visit, some criteria may be missing or patient reported. Any vitals not documented were not able to be obtained and vitals that have been documented are patient reported.  VideoDeclined- This patient declined Librarian, academic. Therefore the visit was completed with audio only.  Persons Participating in Visit: Patient.  AWV Questionnaire: No: Patient Medicare AWV questionnaire was not completed prior to this visit.  Cardiac Risk Factors include: advanced age (>56men, >54 women);dyslipidemia     Objective:    Today's Vitals   07/18/23 0850  Weight: 113 lb (51.3 kg)  Height: 5' (1.524 m)   Body mass index is 22.07 kg/m.     07/18/2023    9:15 AM 10/01/2021    2:10 PM 11/18/2014    8:36 AM  Advanced Directives  Does Patient Have a Medical Advance Directive? Yes Yes No   Type of Estate agent of [place];Living will Healthcare Power of [place];Living will   Does patient want to make changes to medical advance directive?  No - Patient declined   Copy of Healthcare Power of Attorney in Chart? No - copy requested No - copy requested      Data saved with a previous  flowsheet row definition    Current Medications (verified) Outpatient Encounter Medications as of 07/18/2023  Medication Sig   aspirin  EC 81 MG tablet Take 1 tablet (81 mg total) by mouth daily. Swallow whole.   atorvastatin  (LIPITOR) 10 MG tablet TAKE 1 TABLET BY MOUTH EVERY DAY   ibuprofen (ADVIL) 200 MG tablet Take 200 mg by mouth every 8 (eight) hours as needed.   loratadine (CLARITIN) 10 MG tablet Take 10 mg by mouth daily as needed for allergies.   meloxicam  (MOBIC ) 15 MG tablet Take 1 tablet (15 mg total) by mouth daily. (Patient taking differently: Take 15 mg by mouth daily as needed.)   pantoprazole  (PROTONIX ) 40 MG tablet Take 1 tablet (40 mg total) by mouth daily. (Patient taking differently: Take 40 mg by mouth daily as needed.)   thyroid  (ARMOUR THYROID ) 60 MG tablet TAKE ONE TABLET BY MOUTH EVERY DAY IN THE MORNING, TAKE TWO TABLETS ON FRIDAY   triamcinolone  ointment (KENALOG ) 0.5 % Apply 1 Application topically 2 (two) times daily. (Patient taking differently: Apply 1 Application topically 2 (two) times daily as needed.)   valACYclovir  (VALTREX ) 1000 MG tablet Take 1 tablet (1,000 mg total) by mouth 2 (two) times daily.   valACYclovir  (VALTREX ) 500 MG tablet Take 1 to 2 tablets by mouth every day as directed   estradiol  (ESTRACE ) 0.1 MG/GM vaginal cream Place a small dab on urethra and labia every night FOR 14 NIGHTS then twice weekly thereafter (Patient not taking: Reported on 07/18/2023)   No facility-administered encounter medications on file as of 07/18/2023.  Allergies (verified) Codeine, Corticosteroids, Levothyroxine, Oxycodone -acetaminophen, and Doxycycline    History: Past Medical History:  Diagnosis Date   Anxiety    Depression    Eczema    Hypothyroidism    Osteoporosis    Recurrent herpes simplex    Thyroid  disease    Past Surgical History:  Procedure Laterality Date   ABDOMINAL HYSTERECTOMY     BUNIONECTOMY Bilateral    CARPAL TUNNEL RELEASE Bilateral     KNEE ARTHROSCOPY Left    SHOULDER ARTHROSCOPY WITH OPEN ROTATOR CUFF REPAIR Left 12/02/2014   Procedure: left shoulder arthroscopy, arthroscopic debridement, subacromial decompression, mini open rotator cuff repair;  Surgeon: Elner Hahn, MD;  Location: [facility] ORS;  Service: Orthopedics;  Laterality: Left;   Family History  Problem Relation Age of Onset   Arthritis Mother    Heart disease Father    Hypertension Father    Breast cancer Neg Hx    Social History   Socioeconomic History   Marital status: Married    Spouse name: Not on file   Number of children: Not on file   Years of education: Not on file   Highest education level: Not on file  Occupational History   Not on file  Tobacco Use   Smoking status: Former    Current packs/day: 0.00    Average packs/day: 0.5 packs/day for 10.0 years (5.0 ttl pk-yrs)    Types: Cigarettes    Start date: 04/14/1974    Quit date: 04/13/1984    Years since quitting: 39.2   Smokeless tobacco: Not on file  Substance and Sexual Activity   Alcohol use: Yes    Alcohol/week: 0.0 standard drinks of alcohol    Comment: Wine or mixed drink in the evening   Drug use: No   Sexual activity: Never  Other Topics Concern   Not on file  Social History Narrative   Not on file   Social Drivers of Health   Financial Resource Strain: Low Risk  (07/18/2023)   Overall Financial Resource Strain (CARDIA)    Difficulty of Paying Living Expenses: Not hard at all  Food Insecurity: No Food Insecurity (07/18/2023)   Hunger Vital Sign    Worried About Running Out of Food in the Last Year: Never true    Ran Out of Food in the Last Year: Never true  Transportation Needs: No Transportation Needs (07/18/2023)   PRAPARE - Administrator, Civil Service (Medical): No    Lack of Transportation (Non-Medical): No  Physical Activity: Sufficiently Active (07/18/2023)   Exercise Vital Sign    Days of Exercise per Week: 6 days    Minutes of Exercise per Session:  60 min  Stress: Stress Concern Present (07/18/2023)   [employer] of Occupational Health - Occupational Stress Questionnaire    Feeling of Stress: To some extent  Social Connections: Moderately Integrated (07/18/2023)   Social Connection and Isolation Panel    Frequency of Communication with Friends and Family: More than three times a week    Frequency of Social Gatherings with Friends and Family: More than three times a week    Attends Religious Services: More than 4 times per year    Active Member of [employer] or Organizations: No    Attends Banker Meetings: Never    Marital Status: Married    Tobacco Counseling Counseling given: Not Answered    Clinical Intake:  Pre-visit preparation completed: Yes  Pain : No/denies pain     BMI - recorded:  22.07 Nutritional Status: BMI of 19-24  Normal Nutritional Risks: None Diabetes: No  Lab Results  Component Value Date   HGBA1C 5.6 07/16/2021     How often do you need to have someone help you when you read instructions, pamphlets, or other written materials from your doctor or pharmacy?: 1 - Never  Interpreter Needed?: No  Information entered by :: R. Syaire Saber LPN   Activities of Daily Living     07/18/2023    8:51 AM  In your present state of health, do you have any difficulty performing the following activities:  Hearing? 0  Vision? 0  Comment glasses  Difficulty concentrating or making decisions? 1  Walking or climbing stairs? 0  Dressing or bathing? 0  Doing errands, shopping? 0  Preparing Food and eating ? N  Using the Toilet? N  In the past six months, have you accidently leaked urine? Y  Do you have problems with loss of bowel control? N  Managing your Medications? N  Managing your Finances? N  Housekeeping or managing your Housekeeping? N    Patient Care Team: Thersia Flax, MD as PCP - General (Internal Medicine)  I have updated your Care Teams any recent Medical Services you may have  received from other providers in the past year.     Assessment:   This is a routine wellness examination for Courtney Jensen.  Hearing/Vision screen Hearing Screening - Comments:: No issues Vision Screening - Comments:: glasses   Goals Addressed             This Visit's Progress    Patient Stated       Wants to exercise more and strength building       Depression Screen     07/18/2023    9:10 AM 02/14/2022    1:54 PM 10/01/2021    2:07 PM 08/13/2021    1:30 PM 07/16/2021   10:48 AM 10/27/2014    1:26 PM 10/29/2012   10:26 AM  PHQ 2/9 Scores  PHQ - 2 Score 0 0 0 0 0 0 0  PHQ- 9 Score 2          Fall Risk     07/18/2023    8:52 AM 02/14/2022    1:53 PM 10/01/2021    2:41 PM 08/13/2021    1:30 PM 07/16/2021   10:48 AM  Fall Risk   Falls in the past year? 1 1  1 1   Number falls in past yr: 0 0  1 1  Injury with Fall? 0 0  0 0  Risk for fall due to : History of fall(s);Impaired balance/gait History of fall(s)  History of fall(s) History of fall(s)  Follow up Falls evaluation completed;Falls prevention discussed Falls evaluation completed  Falls evaluation completed  Falls evaluation completed  Falls evaluation completed      Data saved with a previous flowsheet row definition    MEDICARE RISK AT HOME:  Medicare Risk at Home Any stairs in or around the home?: Yes If so, are there any without handrails?: No Home free of loose throw rugs in walkways, pet beds, electrical cords, etc?: No (discussed removing or getting rubber backing to hold in place) Adequate lighting in your home to reduce risk of falls?: Yes Life alert?: No Use of a cane, walker or w/c?: No Grab bars in the bathroom?: No Shower chair or bench in shower?: No Elevated toilet seat or a handicapped toilet?: Yes  TIMED UP AND GO:  Was  the test performed?  No  Cognitive Function: 6CIT completed        07/18/2023    9:16 AM 10/01/2021    2:43 PM  6CIT Screen  What Year? 0 points 0 points  What month? 0 points  0 points  What time? 0 points 0 points  Count back from 20 0 points 0 points  Months in reverse 2 points 0 points  Repeat phrase 0 points 4 points  Total Score 2 points 4 points    Immunizations Immunization History  Administered Date(s) Administered   Fluad Quad(high Dose 65+) 12/24/2021   Fluad Trivalent(High Dose 65+) 11/16/2022   Influenza Whole 10/29/2007, 12/17/2008   Influenza, High Dose Seasonal PF 10/27/2014, 11/16/2020   Influenza,inj,Quad PF,6+ Mos 10/29/2012, 11/30/2015   Influenza-Unspecified 10/31/2013   Moderna Covid-19 Fall Seasonal Vaccine 60yrs & older 12/24/2021   Moderna Sars-Covid-2 Vaccination 04/03/2019, 05/01/2019   PNEUMOCOCCAL CONJUGATE-20 02/14/2022   Pneumococcal Conjugate-13 10/27/2014   Pneumococcal Polysaccharide-23 10/27/2010   Td 12/17/2008   Tdap 10/29/2012   Zoster, Live 10/27/2006    Screening Tests Health Maintenance  Topic Date Due   Zoster Vaccines- Shingrix (1 of 2) 04/06/1964   COVID-19 Vaccine (4 - 2024-25 season) 10/02/2022   DTaP/Tdap/Td (3 - Td or Tdap) 10/30/2022   INFLUENZA VACCINE  09/01/2023   Medicare Annual Wellness (AWV)  07/17/2024   Colonoscopy  03/04/2031   Pneumococcal Vaccine: 50+ Years  Completed   DEXA SCAN  Completed   Hepatitis C Screening  Completed   HPV VACCINES  Aged Out   Meningococcal B Vaccine  Aged Out    Health Maintenance  Health Maintenance Due  Topic Date Due   Zoster Vaccines- Shingrix (1 of 2) 04/06/1964   COVID-19 Vaccine (4 - 2024-25 season) 10/02/2022   DTaP/Tdap/Td (3 - Td or Tdap) 10/30/2022   Health Maintenance Items Addressed: Discussed the need to update Tetanus and shingles vaccines. Patient wants to discuss at her next office visit  how often she should have a mammogram and dexa at her age. Patient prefers not to have mammogram yearly.  Additional Screening:  Vision Screening: Recommended annual ophthalmology exams for early detection of glaucoma and other disorders of the  eye.Up to date [facility] Would you like a referral to an eye doctor? No    Dental Screening: Recommended annual dental exams for proper oral hygiene  Community Resource Referral / Chronic Care Management: CRR required this visit?  No   CCM required this visit?  No   Plan:    I have personally reviewed and noted the following in the patient's chart:   Medical and social history Use of alcohol, tobacco or illicit drugs  Current medications and supplements including opioid prescriptions. Patient is not currently taking opioid prescriptions. Functional ability and status Nutritional status Physical activity Advanced directives List of other physicians Hospitalizations, surgeries, and ER visits in previous 12 months Vitals Screenings to include cognitive, depression, and falls Referrals and appointments  In addition, I have reviewed and discussed with patient certain preventive protocols, quality metrics, and best practice recommendations. A written personalized care plan for preventive services as well as general preventive health recommendations were provided to patient.   Courtney Horse, LPN   [id number]/[id number]   After Visit Summary: (MyChart) Due to this being a telephonic visit, the after visit summary with patients personalized plan was offered to patient via MyChart   Notes: Nothing significant to report at this time.  Phone note sent to PCP

## 2023-07-18 NOTE — Patient Instructions (Signed)
 Ms. Gores , Thank you for taking time out of your busy schedule to complete your Annual Wellness Visit with me. I enjoyed our conversation and look forward to speaking with you again next year. I, as well as your care team,  appreciate your ongoing commitment to your health goals. Please review the following plan we discussed and let me know if I can assist you in the future. Your Game plan/ To Do List    Referrals: If you haven't heard from the office you've been referred to, please reach out to them at the phone provided.  Remember to update your tetanus and shingles vaccines Follow up Visits: Next Medicare AWV with our clinical staff: 07/22/24 @# 9:30   Have you seen your provider in the last 6 months (3 months if uncontrolled diabetes)? No Next Office Visit with your provider: 09/19/23 (acute visit scheduled 07/24/23)  Clinician Recommendations:  Aim for 30 minutes of exercise or brisk walking, 6-8 glasses of water, and 5 servings of fruits and vegetables each day.       This is a list of the screening recommended for you and due dates:  Health Maintenance  Topic Date Due   Zoster (Shingles) Vaccine (1 of 2) 04/06/1964   COVID-19 Vaccine (4 - 2024-25 season) 10/02/2022   DTaP/Tdap/Td vaccine (3 - Td or Tdap) 10/30/2022   Flu Shot  09/01/2023   Medicare Annual Wellness Visit  07/17/2024   Colon Cancer Screening  03/04/2031   Pneumococcal Vaccine for age over 39  Completed   DEXA scan (bone density measurement)  Completed   Hepatitis C Screening  Completed   HPV Vaccine  Aged Out   Meningitis B Vaccine  Aged Out    Advanced directives: (Copy Requested) Please bring a copy of your health care power of attorney and living will to the office to be added to your chart at your convenience. You can mail to [facility] [address] [place], [place] [address] or email to [email or web address] Advance Care Planning is important because it:  [x]  Makes sure you  receive the medical care that is consistent with your values, goals, and preferences  [x]  It provides guidance to your family and loved ones and reduces their decisional burden about whether or not they are making the right decisions based on your wishes.

## 2023-07-24 ENCOUNTER — Encounter: Payer: Self-pay | Admitting: Internal Medicine

## 2023-07-24 ENCOUNTER — Ambulatory Visit (INDEPENDENT_AMBULATORY_CARE_PROVIDER_SITE_OTHER): Admitting: Internal Medicine

## 2023-07-24 VITALS — BP 116/74 | HR 70 | Temp 97.8°F | Ht 60.0 in | Wt 113.0 lb

## 2023-07-24 DIAGNOSIS — E034 Atrophy of thyroid (acquired): Secondary | ICD-10-CM | POA: Diagnosis not present

## 2023-07-24 DIAGNOSIS — L304 Erythema intertrigo: Secondary | ICD-10-CM | POA: Insufficient documentation

## 2023-07-24 DIAGNOSIS — E782 Mixed hyperlipidemia: Secondary | ICD-10-CM | POA: Diagnosis not present

## 2023-07-24 DIAGNOSIS — I700 Atherosclerosis of aorta: Secondary | ICD-10-CM

## 2023-07-24 LAB — COMPREHENSIVE METABOLIC PANEL WITH GFR
ALT: 29 U/L (ref 0–35)
AST: 36 U/L (ref 0–37)
Albumin: 4.1 g/dL (ref 3.5–5.2)
Alkaline Phosphatase: 57 U/L (ref 39–117)
BUN: 11 mg/dL (ref 6–23)
CO2: 27 meq/L (ref 19–32)
Calcium: 9.2 mg/dL (ref 8.4–10.5)
Chloride: 104 meq/L (ref 96–112)
Creatinine, Ser: 0.76 mg/dL (ref 0.40–1.20)
GFR: 75.12 mL/min (ref >60.00)
Glucose, Bld: 98 mg/dL (ref 70–99)
Potassium: 3.9 meq/L (ref 3.5–5.1)
Sodium: 139 meq/L (ref 135–145)
Total Bilirubin: 0.6 mg/dL (ref 0.2–1.2)
Total Protein: 6.6 g/dL (ref 6.0–8.3)

## 2023-07-24 LAB — LIPID PANEL
Cholesterol: 141 mg/dL (ref 0–200)
HDL: 79.4 mg/dL (ref >39.00)
LDL Cholesterol: 50 mg/dL (ref 0–99)
NonHDL: 61.86
Total CHOL/HDL Ratio: 2
Triglycerides: 61 mg/dL (ref 0.0–149.0)
VLDL: 12.2 mg/dL (ref 0.0–40.0)

## 2023-07-24 LAB — TSH: TSH: 3.98 u[IU]/mL (ref 0.35–5.50)

## 2023-07-24 MED ORDER — CLOTRIMAZOLE 1 % EX CREA: 1.0000 | TOPICAL_CREAM | Freq: Two times a day (BID) | CUTANEOUS | 0 refills | Status: DC

## 2023-07-24 NOTE — Assessment & Plan Note (Signed)
-   This problem is chronic and stable -Patient is on Lipitor 10 mg daily and her last lipid panel in November showed an LDL of 64 -Will continue with Lipitor 10 mg daily -Will repeat lipid panel today -No further workup at this time

## 2023-07-24 NOTE — Progress Notes (Signed)
 Acute Office Visit  Subjective:     Patient ID: Courtney Jensen, female    DOB: [birth year], 78 y.o.   MRN: [id number]  Chief Complaint  Patient presents with   Acute Visit    Intermittent Rash under both arms, both breasts and the creases of both legs x 6 months Patient is taking Benadryl and using [employer].    HPI Patient is in today for intermittent rash.  Patient states that she has had intermittent rash which is red, itchy and present mainly in her armpits, groin creases, beneath her breasts and in the popliteal fossa bilaterally.  She states that she uses Benadryl and antibiotic cream and this usually resolves.  She states that the rash is not present currently.  She denies any other complaints today  She does state that she has a follow-up appointment Dr. Marylynn in August and would like her blood work done prior to that and is hoping to get that today.  Review of Systems  Constitutional: Negative.   HENT: Negative.    Respiratory: Negative.    Cardiovascular: Negative.   Gastrointestinal: Negative.   Musculoskeletal: Negative.   Skin:  Positive for itching and rash.  Neurological: Negative.   Psychiatric/Behavioral: Negative.          Objective:    BP 116/74   Pulse 70   Temp 97.8 F (36.6 C)   Ht 5' (1.524 m)   Wt 113 lb (51.3 kg)   SpO2 98%   BMI 22.07 kg/m    Physical Exam Constitutional:      Appearance: Normal appearance.  HENT:     Head: Normocephalic and atraumatic.   Cardiovascular:     Rate and Rhythm: Normal rate and regular rhythm.     Heart sounds: Normal heart sounds.  Pulmonary:     Effort: No respiratory distress.     Breath sounds: Normal breath sounds. No wheezing or rales.   Skin:    Findings: No rash.     Comments: No rash noted in armpits or beneath her breasts   Neurological:     Mental Status: She is alert.   Psychiatric:        Mood and Affect: Mood normal.        Behavior: Behavior normal.     No results found for  any visits on 07/24/23.      Assessment & Plan:   Problem List Items Addressed This Visit       Cardiovascular and Mediastinum   Thoracic aortic atherosclerosis (HCC)   - This problem is chronic and stable -Patient is on Lipitor 10 mg daily -Will recheck a lipid panel and a CMP today -No further workup at this time      Relevant Orders   Comprehensive metabolic panel with GFR     Endocrine   Hypothyroidism - Primary   - This problem is chronic and stable -Patient is on Armour Thyroid  60 mg daily -Her last TSH in November was within normal limits -Will repeat this today -No further workup at this time      Relevant Orders   TSH     Musculoskeletal and Integument   Intertrigo   - Patient complains of an intermittent rash beneath her armpits,in her groin creases, beneath her breasts as well as in a popliteal fossa bilaterally -States that the rash is red and itchy -Usually resolves with Benadryl and antibiotic cream -I suspect patient likely has intertrigo -I have prescribed clotrimazole cream to use  as needed -Return precautions given to the patient      Relevant Medications   clotrimazole (LOTRIMIN) 1 % cream     Other   Hyperlipidemia   - This problem is chronic and stable -Patient is on Lipitor 10 mg daily and her last lipid panel in November showed an LDL of 64 -Will continue with Lipitor 10 mg daily -Will repeat lipid panel today -No further workup at this time      Relevant Orders   Comprehensive metabolic panel with GFR   Lipid panel    Meds ordered this encounter  Medications   clotrimazole (LOTRIMIN) 1 % cream    Sig: Apply 1 Application topically 2 (two) times daily.    Dispense:  30 g    Refill:  0    No follow-ups on file.  Onnika Siebel, MD

## 2023-07-24 NOTE — Assessment & Plan Note (Signed)
-   Patient complains of an intermittent rash beneath her armpits,in her groin creases, beneath her breasts as well as in a popliteal fossa bilaterally -States that the rash is red and itchy -Usually resolves with Benadryl and antibiotic cream -I suspect patient likely has intertrigo -I have prescribed clotrimazole cream to use as needed -Return precautions given to the patient

## 2023-07-24 NOTE — Patient Instructions (Signed)
-   It was a pleasure meeting you today -I suspect that the rash that you are having is likely a fungal infection (Candida).  Since you have no rash currently there is no indication for treatment but I will prescribe clotrimazole cream for you to use if the rash recurs -Try and keep the area dry and wear loose clothing -We will do some lab work on you today in preparation for your appointment with Dr. Marylynn in August -Please contact us  with any questions or concerns or if your rash recurs and does not respond to the cream.

## 2023-07-24 NOTE — Assessment & Plan Note (Signed)
-   This problem is chronic and stable -Patient is on Lipitor 10 mg daily -Will recheck a lipid panel and a CMP today -No further workup at this time

## 2023-07-24 NOTE — Addendum Note (Signed)
 Addended by: Debany Vantol on: 07/24/2023 01:19 PM   Modules accepted: Level of Service

## 2023-07-24 NOTE — Assessment & Plan Note (Signed)
-   This problem is chronic and stable -Patient is on Armour Thyroid  60 mg daily -Her last TSH in November was within normal limits -Will repeat this today -No further workup at this time

## 2023-07-25 ENCOUNTER — Telehealth: Payer: Self-pay

## 2023-07-25 ENCOUNTER — Ambulatory Visit: Payer: Self-pay | Admitting: Internal Medicine

## 2023-07-25 DIAGNOSIS — G3184 Mild cognitive impairment, so stated: Secondary | ICD-10-CM | POA: Diagnosis not present

## 2023-07-25 DIAGNOSIS — Z1331 Encounter for screening for depression: Secondary | ICD-10-CM | POA: Diagnosis not present

## 2023-07-25 NOTE — Telephone Encounter (Signed)
 Copied from CRM [id number]. Topic: General - Other >> Jul 25, 2023  1:14 PM [place] J wrote: Reason for CRM: Patient is returning a call from [facility] in regards to some information on a follow-up visit. The patient would like a call back from Melissa once she's available.

## 2023-07-31 ENCOUNTER — Other Ambulatory Visit: Payer: Self-pay | Admitting: Internal Medicine

## 2023-08-01 NOTE — Telephone Encounter (Unsigned)
 Copied from CRM [id number]. Topic: General - Other >> Aug 01, 2023  2:43 PM Adrionna Y wrote: Reason for CRM: Patient calling to let harlene know she still takes the meloxicam , but she has not requested  a refill on the medication yet she still has plenty   She stated she just returned home if any other information is needed give her a call

## 2023-08-01 NOTE — Telephone Encounter (Signed)
 LMTCB. Need to ask pt if she is still taking this medication and if she requested the refill.   Refilled: 06/28/2023 Last OV: 07/24/2023 Next OV: 09/19/2023

## 2023-08-02 NOTE — Telephone Encounter (Signed)
 Pt does not need refill at this time. Please refuse the request.

## 2023-08-29 DIAGNOSIS — F5105 Insomnia due to other mental disorder: Secondary | ICD-10-CM | POA: Diagnosis not present

## 2023-08-29 DIAGNOSIS — F4323 Adjustment disorder with mixed anxiety and depressed mood: Secondary | ICD-10-CM | POA: Diagnosis not present

## 2023-08-30 ENCOUNTER — Other Ambulatory Visit: Payer: Self-pay | Admitting: Internal Medicine

## 2023-08-30 DIAGNOSIS — L304 Erythema intertrigo: Secondary | ICD-10-CM

## 2023-09-12 DIAGNOSIS — F4323 Adjustment disorder with mixed anxiety and depressed mood: Secondary | ICD-10-CM | POA: Diagnosis not present

## 2023-09-12 DIAGNOSIS — F5105 Insomnia due to other mental disorder: Secondary | ICD-10-CM | POA: Diagnosis not present

## 2023-09-19 ENCOUNTER — Ambulatory Visit: Admitting: Internal Medicine

## 2023-09-19 DIAGNOSIS — E034 Atrophy of thyroid (acquired): Secondary | ICD-10-CM

## 2023-09-26 DIAGNOSIS — R451 Restlessness and agitation: Secondary | ICD-10-CM | POA: Diagnosis not present

## 2023-09-26 DIAGNOSIS — G3184 Mild cognitive impairment, so stated: Secondary | ICD-10-CM | POA: Diagnosis not present

## 2023-09-26 DIAGNOSIS — F439 Reaction to severe stress, unspecified: Secondary | ICD-10-CM | POA: Diagnosis not present

## 2023-09-28 ENCOUNTER — Other Ambulatory Visit: Payer: Self-pay | Admitting: Internal Medicine

## 2023-09-29 ENCOUNTER — Other Ambulatory Visit: Payer: Self-pay | Admitting: Internal Medicine

## 2023-09-29 DIAGNOSIS — E034 Atrophy of thyroid (acquired): Secondary | ICD-10-CM

## 2023-09-29 NOTE — Telephone Encounter (Signed)
 LMTCB. Pt will bee due for her 6 month follow up in December. Please schedule pt when she returns call so she can continue to get refills. Medication has been refilled for 90 days.

## 2023-10-09 DIAGNOSIS — F4323 Adjustment disorder with mixed anxiety and depressed mood: Secondary | ICD-10-CM | POA: Diagnosis not present

## 2023-10-09 DIAGNOSIS — F5105 Insomnia due to other mental disorder: Secondary | ICD-10-CM | POA: Diagnosis not present

## 2023-10-16 ENCOUNTER — Encounter: Payer: Self-pay | Admitting: Internal Medicine

## 2023-10-16 ENCOUNTER — Ambulatory Visit (INDEPENDENT_AMBULATORY_CARE_PROVIDER_SITE_OTHER): Admitting: Internal Medicine

## 2023-10-16 VITALS — BP 106/60 | HR 55 | Ht 60.0 in | Wt 113.2 lb

## 2023-10-16 DIAGNOSIS — Z1231 Encounter for screening mammogram for malignant neoplasm of breast: Secondary | ICD-10-CM

## 2023-10-16 DIAGNOSIS — B009 Herpesviral infection, unspecified: Secondary | ICD-10-CM | POA: Diagnosis not present

## 2023-10-16 DIAGNOSIS — R748 Abnormal levels of other serum enzymes: Secondary | ICD-10-CM

## 2023-10-16 DIAGNOSIS — R471 Dysarthria and anarthria: Secondary | ICD-10-CM

## 2023-10-16 DIAGNOSIS — E034 Atrophy of thyroid (acquired): Secondary | ICD-10-CM | POA: Diagnosis not present

## 2023-10-16 DIAGNOSIS — I679 Cerebrovascular disease, unspecified: Secondary | ICD-10-CM | POA: Diagnosis not present

## 2023-10-16 DIAGNOSIS — E782 Mixed hyperlipidemia: Secondary | ICD-10-CM

## 2023-10-16 DIAGNOSIS — G4733 Obstructive sleep apnea (adult) (pediatric): Secondary | ICD-10-CM

## 2023-10-16 DIAGNOSIS — K52831 Collagenous colitis: Secondary | ICD-10-CM

## 2023-10-16 DIAGNOSIS — Z780 Asymptomatic menopausal state: Secondary | ICD-10-CM | POA: Diagnosis not present

## 2023-10-16 DIAGNOSIS — G3184 Mild cognitive impairment, so stated: Secondary | ICD-10-CM

## 2023-10-16 LAB — COMPREHENSIVE METABOLIC PANEL WITH GFR
ALT: 69 U/L — ABNORMAL HIGH (ref 0–35)
AST: 53 U/L — ABNORMAL HIGH (ref 0–37)
Albumin: 4.3 g/dL (ref 3.5–5.2)
Alkaline Phosphatase: 63 U/L (ref 39–117)
BUN: 8 mg/dL (ref 6–23)
CO2: 31 meq/L (ref 19–32)
Calcium: 9.5 mg/dL (ref 8.4–10.5)
Chloride: 105 meq/L (ref 96–112)
Creatinine, Ser: 0.79 mg/dL (ref 0.40–1.20)
GFR: 71.59 mL/min (ref >60.00)
Glucose, Bld: 62 mg/dL — ABNORMAL LOW (ref 70–99)
Potassium: 4 meq/L (ref 3.5–5.1)
Sodium: 142 meq/L (ref 135–145)
Total Bilirubin: 0.7 mg/dL (ref 0.2–1.2)
Total Protein: 6.4 g/dL (ref 6.0–8.3)

## 2023-10-16 MED ORDER — VALACYCLOVIR HCL 500 MG PO TABS: ORAL_TABLET | ORAL | 3 refills | Status: AC

## 2023-10-16 MED ORDER — THYROID 60 MG PO TABS: ORAL_TABLET | ORAL | 0 refills | Status: DC

## 2023-10-16 MED ORDER — TETANUS-DIPHTH-ACELL PERTUSSIS 5-2.5-18.5 LF-MCG/0.5 IM SUSY: 0.5000 mL | PREFILLED_SYRINGE | Freq: Once | INTRAMUSCULAR | 0 refills | Status: DC

## 2023-10-16 MED ORDER — TETANUS-DIPHTH-ACELL PERTUSSIS 5-2.5-18.5 LF-MCG/0.5 IM SUSY: 0.5000 mL | PREFILLED_SYRINGE | Freq: Once | INTRAMUSCULAR | 0 refills | Status: AC

## 2023-10-16 MED ORDER — DOXYCYCLINE HYCLATE 100 MG PO TABS: 100.0000 mg | ORAL_TABLET | Freq: Two times a day (BID) | ORAL | 0 refills | Status: AC

## 2023-10-16 NOTE — Patient Instructions (Addendum)
 YOUR MAMMOGRAM AND BONE DENSITY TESTS HAVE been ordered , PLEASE CALL AND GET THIS SCHEDULED! [facility] - call [phone]  Veronda does  the scheduling for [facility] as well     You are OVERDUE for your tetanus-diptheria-pertussis vaccine   (TDaP)   Please get this done at your pharmacy l;  it will be PAID FOR MY MEDICARE ONLY AT YOUR PHARMACY.  TETANUS CAN COME FROM ANY ANIMAL BITE , NOT JUST RUSTY NAILS!

## 2023-10-16 NOTE — Assessment & Plan Note (Signed)
 Thyroid  function is WNL on current dose of armour thyroid   No current changes needed.    Lab Results  Component Value Date   TSH 3.98 07/24/2023

## 2023-10-16 NOTE — Assessment & Plan Note (Signed)
 Recent episode lasted one week,  resolved with organic fiber.

## 2023-10-16 NOTE — Assessment & Plan Note (Signed)
 Continue asa and plavix

## 2023-10-16 NOTE — Assessment & Plan Note (Signed)
 S/p neurology evaluation.  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 Which is seen in [facility] but not diagnostic  Emphasized brain health maintenance strategies.

## 2023-10-16 NOTE — Progress Notes (Signed)
 Subjective:  Patient ID: Courtney Jensen, female    DOB: Jul 12, 1945  Age: 78 y.o. MRN: [id number]  CC: The primary encounter diagnosis was Mixed hyperlipidemia. Diagnoses of Hypothyroidism due to acquired atrophy of thyroid , Screening mammogram for breast cancer, Asymptomatic postmenopausal estrogen deficiency, MCI (mild cognitive impairment) with memory loss, Cerebrovascular small vessel disease, Dysarthria, Collagenous colitis, Herpes simplex disease, and OSA (obstructive sleep apnea) were also pertinent to this visit.   HPI Ronniesha Seibold presents for  Chief Complaint  Patient presents with   Medical Management of Chronic Issues   Dysarthria:  prescribed sertraline  for the last 2 months.  Taking 50 mg daily .  Stress level is better and articulating better.   Insomnia managed with 100 , mg trazodone  latency is 2-30 mintues . Averalges 7 hours. Not using CPAP    Collagenous colitis:  she has recovered from a Recent week long episode diarrhea.     used organic fiber to bulk up stools.    Resolved now   Dyani remains physicially active.  [place] with [place].  She Splits her time between [place] and here.  Goes up for 2-3 nights every 14 days   HM:  she is behing in vaccinations,  mammogram and DEXA scans.  All were disucssed today        Outpatient Medications Prior to Visit  Medication Sig Dispense Refill   aspirin  EC 81 MG tablet Take 1 tablet (81 mg total) by mouth daily. Swallow whole. 90 tablet 1   atorvastatin  (LIPITOR) 10 MG tablet TAKE 1 TABLET BY MOUTH EVERY DAY 90 tablet 1   loratadine (CLARITIN) 10 MG tablet Take 10 mg by mouth daily as needed for allergies.     sertraline  (ZOLOFT ) 50 MG tablet Take 50 mg by mouth daily.     traZODone  (DESYREL ) 100 MG tablet Take 100 mg by mouth at bedtime.     triamcinolone  ointment (KENALOG ) 0.5 % Apply 1 Application topically 2 (two) times daily. (Patient taking  differently: Apply 1 Application topically 2 (two) times daily as needed.) 80 g 0   thyroid  (ARMOUR THYROID ) 60 MG tablet TAKE ONE TABLET BY MOUTH EVERY DAY IN THE MORNING, TAKE TWO TABLETS ON FRIDAY 104 tablet 0   valACYclovir  (VALTREX ) 500 MG tablet Take 1 to 2 tablets by mouth every day as directed 180 tablet 3   clotrimazole  (LOTRIMIN ) 1 % cream APPLY TO AFFECTED AREA TWICE A DAY 30 g 0   estradiol  (ESTRACE ) 0.1 MG/GM vaginal cream Place a small dab on urethra and labia every night FOR 14 NIGHTS then twice weekly thereafter (Patient not taking: Reported on 10/16/2023) 42.5 g 0   meloxicam  (MOBIC ) 15 MG tablet Take 1 tablet (15 mg total) by mouth daily as needed. (Patient not taking: Reported on 10/16/2023) 30 tablet 2   pantoprazole  (PROTONIX ) 40 MG tablet TAKE 1 TABLET BY MOUTH EVERY DAY 30 tablet 0   valACYclovir  (VALTREX ) 1000 MG tablet Take 1 tablet (1,000 mg total) by mouth 2 (two) times daily. 14 tablet 0   No facility-administered medications prior to visit.    Review of Systems;  Patient denies headache, fevers, malaise, unintentional weight loss, skin rash, eye pain, sinus congestion and sinus pain, sore throat, dysphagia,  hemoptysis , cough, dyspnea, wheezing, chest pain, palpitations, orthopnea, edema, abdominal pain, nausea, melena, diarrhea, constipation, flank pain, dysuria, hematuria, urinary  Frequency, nocturia, numbness, tingling, seizures,  Focal weakness, Loss  of consciousness,  Tremor, insomnia, depression, anxiety, and suicidal ideation.      Objective:  BP 106/60   Pulse (!) 55   Ht 5' (1.524 m)   Wt 113 lb 3.2 oz (51.3 kg)   SpO2 97%   BMI 22.11 kg/m   BP Readings from Last 3 Encounters:  10/16/23 106/60  07/24/23 116/74  12/16/22 112/66    Wt Readings from Last 3 Encounters:  10/16/23 113 lb 3.2 oz (51.3 kg)  07/24/23 113 lb (51.3 kg)  07/18/23 113 lb (51.3 kg)    Physical Exam Vitals reviewed.  Constitutional:      General: She is not in acute  distress.    Appearance: Normal appearance. She is normal weight. She is not ill-appearing, toxic-appearing or diaphoretic.  HENT:     Head: Normocephalic.  Eyes:     General: No scleral icterus.       Right eye: No discharge.        Left eye: No discharge.     Conjunctiva/sclera: Conjunctivae normal.  Cardiovascular:     Rate and Rhythm: Normal rate and regular rhythm.     Heart sounds: Normal heart sounds.  Pulmonary:     Effort: Pulmonary effort is normal. No respiratory distress.     Breath sounds: Normal breath sounds.  Musculoskeletal:        General: Normal range of motion.  Skin:    General: Skin is warm and dry.  Neurological:     General: No focal deficit present.     Mental Status: She is alert and oriented to person, place, and time. Mental status is at baseline.  Psychiatric:        Mood and Affect: Mood normal.        Behavior: Behavior normal.        Thought Content: Thought content normal.        Judgment: Judgment normal.    Lab Results  Component Value Date   HGBA1C 5.6 07/16/2021    Lab Results  Component Value Date   CREATININE 0.76 07/24/2023   CREATININE 0.83 12/16/2022   CREATININE 0.79 03/07/2022    Lab Results  Component Value Date   WBC 4.9 07/16/2021   HGB 13.4 07/16/2021   HCT 41.1 07/16/2021   PLT 166.0 07/16/2021   GLUCOSE 98 07/24/2023   CHOL 141 07/24/2023   TRIG 61.0 07/24/2023   HDL 79.40 07/24/2023   LDLDIRECT 49.0 02/14/2022   LDLCALC 50 07/24/2023   ALT 29 07/24/2023   AST 36 07/24/2023   NA 139 07/24/2023   K 3.9 07/24/2023   CL 104 07/24/2023   CREATININE 0.76 07/24/2023   BUN 11 07/24/2023   CO2 27 07/24/2023   TSH 3.98 07/24/2023   HGBA1C 5.6 07/16/2021    No results found.  Assessment & Plan:  .Mixed hyperlipidemia -     Comprehensive metabolic panel with GFR  Hypothyroidism due to acquired atrophy of thyroid  Assessment & Plan: Thyroid  function is WNL on current dose of armour thyroid   No current  changes needed.    Lab Results  Component Value Date   TSH 3.98 07/24/2023     Orders: -     Thyroid ; TAKE ONE TABLET BY MOUTH EVERY DAY IN THE MORNING, TAKE TWO TABLETS ON FRIDAY  Dispense: 104 tablet; Refill: 0  Screening mammogram for breast cancer -     3D Screening Mammogram, Left and Right; Future  Asymptomatic postmenopausal estrogen deficiency -     DG  Bone Density; Future  MCI (mild cognitive impairment) with memory loss Assessment & Plan: S/p neurology evaluation.  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 Which is seen in [facility] but not diagnostic  Emphasized brain health maintenance strategies.    Cerebrovascular small vessel disease Assessment & Plan: Continue asa and plavix    Dysarthria Assessment & Plan: She was referred to psychiatry and started on sertraline  for management of anxiety which has been implicated as one of the factors affecting her speech    Collagenous colitis Assessment & Plan: Recent episode lasted one week,  resolved with organic fiber.     Herpes simplex disease Assessment & Plan: Continue suppression with valtrex     OSA (obstructive sleep apnea) Assessment & Plan: Mild,  by home sleep study done in Dec 2023.  AHI was 9.9.  desats to 82% noted.  She was referred to pulmonology  for management  and HAS deferred treatment.    Other orders -     valACYclovir  HCl; Take 1 to 2 tablets by mouth every day as directed  Dispense: 180 tablet; Refill: 3 -     Doxycycline  Hyclate; Take 1 tablet (100 mg total) by mouth 2 (two) times daily.  Dispense: 14 tablet; Refill: 0 -     Tetanus-Diphth-Acell Pertussis; Inject 0.5 mLs into the muscle once for 1 dose.  Dispense: 0.5 mL; Refill: 0    I personally spent a total of 30 minutes in the care of the patient today including preparing to see the patient, getting/reviewing separately obtained history,  performing a medically appropriate exam/evaluation, counseling and educating, placing orders, documenting clinical information in the EHR, independently interpreting results, and communicating results.    Follow-up: Return in about 6 months (around 04/14/2024).   Verneita LITTIE Kettering, MD

## 2023-10-16 NOTE — Assessment & Plan Note (Addendum)
 Mild,  by home sleep study done in Dec 2023.  AHI was 9.9.  desats to 82% noted.  She was referred to pulmonology  for management  and HAS deferred treatment.

## 2023-10-16 NOTE — Assessment & Plan Note (Signed)
 She was referred to psychiatry and started on sertraline  for management of anxiety which has been implicated as one of the factors affecting her speech

## 2023-10-16 NOTE — Assessment & Plan Note (Signed)
 Continue suppression with valtrex 

## 2023-10-17 ENCOUNTER — Ambulatory Visit: Payer: Self-pay | Admitting: Internal Medicine

## 2023-10-17 DIAGNOSIS — R748 Abnormal levels of other serum enzymes: Secondary | ICD-10-CM | POA: Insufficient documentation

## 2023-10-17 NOTE — Assessment & Plan Note (Signed)
 Excellent HDL:LDl ratio.   she is tolerating atorvastatin  for documented PAD and LDL is  70.  LFts need repeating in one week due to new onset elevation    Lab Results  Component Value Date   CHOL 141 07/24/2023   HDL 79.40 07/24/2023   LDLCALC 50 07/24/2023   LDLDIRECT 49.0 02/14/2022   TRIG 61.0 07/24/2023   CHOLHDL 2 07/24/2023   Lab Results  Component Value Date   ALT 69 (H) 10/16/2023   AST 53 (H) 10/16/2023   ALKPHOS 63 10/16/2023   BILITOT 0.7 10/16/2023

## 2023-10-17 NOTE — Addendum Note (Signed)
 Addended by: MARYLYNN VERNEITA CROME on: 10/17/2023 12:53 PM   Modules accepted: Orders

## 2023-10-17 NOTE — Assessment & Plan Note (Signed)
 Mild elevation noted on surveillance labs .  She is taking a statin.  Will repeat in one week

## 2023-10-27 ENCOUNTER — Other Ambulatory Visit (INDEPENDENT_AMBULATORY_CARE_PROVIDER_SITE_OTHER)

## 2023-10-27 DIAGNOSIS — R748 Abnormal levels of other serum enzymes: Secondary | ICD-10-CM | POA: Diagnosis not present

## 2023-10-27 LAB — HEPATIC FUNCTION PANEL
ALT: 51 U/L — ABNORMAL HIGH (ref 0–35)
AST: 51 U/L — ABNORMAL HIGH (ref 0–37)
Albumin: 4.5 g/dL (ref 3.5–5.2)
Alkaline Phosphatase: 65 U/L (ref 39–117)
Bilirubin, Direct: 0.2 mg/dL (ref 0.0–0.3)
Total Bilirubin: 0.8 mg/dL (ref 0.2–1.2)
Total Protein: 6.6 g/dL (ref 6.0–8.3)

## 2023-10-28 NOTE — Addendum Note (Signed)
 Addended by: MARYLYNN VERNEITA CROME on: 10/28/2023 07:36 PM   Modules accepted: Orders

## 2023-11-20 ENCOUNTER — Other Ambulatory Visit

## 2023-11-20 DIAGNOSIS — R748 Abnormal levels of other serum enzymes: Secondary | ICD-10-CM | POA: Diagnosis not present

## 2023-11-20 LAB — HEPATIC FUNCTION PANEL
ALT: 27 U/L (ref 0–35)
AST: 30 U/L (ref 0–37)
Albumin: 4.2 g/dL (ref 3.5–5.2)
Alkaline Phosphatase: 58 U/L (ref 39–117)
Bilirubin, Direct: 0.1 mg/dL (ref 0.0–0.3)
Total Bilirubin: 0.4 mg/dL (ref 0.2–1.2)
Total Protein: 6.6 g/dL (ref 6.0–8.3)

## 2023-11-22 ENCOUNTER — Encounter: Payer: Self-pay | Admitting: Internal Medicine

## 2023-11-22 ENCOUNTER — Other Ambulatory Visit: Payer: Self-pay | Admitting: Internal Medicine

## 2023-11-22 DIAGNOSIS — E782 Mixed hyperlipidemia: Secondary | ICD-10-CM

## 2023-12-04 ENCOUNTER — Encounter: Payer: Self-pay | Admitting: Internal Medicine

## 2023-12-04 ENCOUNTER — Other Ambulatory Visit: Payer: Self-pay | Admitting: Internal Medicine

## 2023-12-06 NOTE — Telephone Encounter (Signed)
 Noted

## 2023-12-18 ENCOUNTER — Other Ambulatory Visit (INDEPENDENT_AMBULATORY_CARE_PROVIDER_SITE_OTHER)

## 2023-12-18 DIAGNOSIS — E782 Mixed hyperlipidemia: Secondary | ICD-10-CM | POA: Diagnosis not present

## 2023-12-18 LAB — COMPREHENSIVE METABOLIC PANEL WITH GFR
ALT: 24 U/L (ref 0–35)
AST: 30 U/L (ref 0–37)
Albumin: 4.1 g/dL (ref 3.5–5.2)
Alkaline Phosphatase: 67 U/L (ref 39–117)
BUN: 11 mg/dL (ref 6–23)
CO2: 28 meq/L (ref 19–32)
Calcium: 9 mg/dL (ref 8.4–10.5)
Chloride: 104 meq/L (ref 96–112)
Creatinine, Ser: 0.75 mg/dL (ref 0.40–1.20)
GFR: 76.11 mL/min
Glucose, Bld: 86 mg/dL (ref 70–99)
Potassium: 3.9 meq/L (ref 3.5–5.1)
Sodium: 141 meq/L (ref 135–145)
Total Bilirubin: 0.7 mg/dL (ref 0.2–1.2)
Total Protein: 6.2 g/dL (ref 6.0–8.3)

## 2023-12-20 ENCOUNTER — Ambulatory Visit: Payer: Self-pay | Admitting: Internal Medicine

## 2024-01-08 DIAGNOSIS — F4323 Adjustment disorder with mixed anxiety and depressed mood: Secondary | ICD-10-CM | POA: Diagnosis not present

## 2024-01-08 DIAGNOSIS — F5105 Insomnia due to other mental disorder: Secondary | ICD-10-CM | POA: Diagnosis not present

## 2024-01-11 ENCOUNTER — Other Ambulatory Visit: Payer: Self-pay | Admitting: Internal Medicine

## 2024-01-20 ENCOUNTER — Other Ambulatory Visit: Payer: Self-pay | Admitting: *Deleted

## 2024-01-20 DIAGNOSIS — R748 Abnormal levels of other serum enzymes: Secondary | ICD-10-CM

## 2024-01-20 DIAGNOSIS — E782 Mixed hyperlipidemia: Secondary | ICD-10-CM

## 2024-01-20 DIAGNOSIS — R7301 Impaired fasting glucose: Secondary | ICD-10-CM

## 2024-01-20 DIAGNOSIS — I700 Atherosclerosis of aorta: Secondary | ICD-10-CM

## 2024-01-20 DIAGNOSIS — Z79899 Other long term (current) drug therapy: Secondary | ICD-10-CM

## 2024-03-05 ENCOUNTER — Telehealth: Payer: Self-pay | Admitting: *Deleted

## 2024-03-05 DIAGNOSIS — E034 Atrophy of thyroid (acquired): Secondary | ICD-10-CM

## 2024-03-05 MED ORDER — THYROID 60 MG PO TABS: ORAL_TABLET | ORAL | 0 refills | Status: AC

## 2024-03-05 NOTE — Telephone Encounter (Signed)
 Copied from CRM [id number]. Topic: Clinical - Medication Refill >> Mar 05, 2024 11:21 AM Berneda FALCON wrote: Medication: thyroid  (ARMOUR THYROID ) 60 MG tablet  Has the patient contacted their pharmacy? Yes (Agent: If no, request that the patient contact the pharmacy for the refill. If patient does not wish to contact the pharmacy document the reason why and proceed with request.) (Agent: If yes, when and what did the pharmacy advise?)  This is the patient's preferred pharmacy:  [facility] [address], [place] - [address] [place] [address] Phone: [phone] Fax: [phone]  Is this the correct pharmacy for this prescription? Yes If no, delete pharmacy and type the correct one.   Has the prescription been filled recently? No  Is the patient out of the medication? No  Has the patient been seen for an appointment in the last year OR does the patient have an upcoming appointment? Yes  Can we respond through MyChart? No  Agent: Please be advised that Rx refills may take up to 3 business days. We ask that you follow-up with your pharmacy.

## 2024-03-05 NOTE — Addendum Note (Signed)
 Addended by: HARRIETTE RAISIN on: 03/05/2024 03:06 PM   Modules accepted: Orders

## 2024-03-05 NOTE — Telephone Encounter (Signed)
Pt is aware that medication has been refilled.  °

## 2024-04-15 ENCOUNTER — Ambulatory Visit: Admitting: Internal Medicine

## 2024-07-22 ENCOUNTER — Ambulatory Visit
